# Patient Record
Sex: Female | Born: 1985 | Race: White | Hispanic: No | Smoking: Former smoker
Health system: Southern US, Community
[De-identification: ages and names within clinical notes are randomized; demographics above are authoritative.]

## PROBLEM LIST (undated history)

## (undated) ENCOUNTER — Inpatient Hospital Stay (HOSPITAL_COMMUNITY): Payer: Self-pay

## (undated) DIAGNOSIS — E039 Hypothyroidism, unspecified: Secondary | ICD-10-CM

## (undated) DIAGNOSIS — B009 Herpesviral infection, unspecified: Secondary | ICD-10-CM

## (undated) DIAGNOSIS — O009 Unspecified ectopic pregnancy without intrauterine pregnancy: Secondary | ICD-10-CM

## (undated) HISTORY — DX: Hypothyroidism, unspecified: E03.9

## (undated) HISTORY — PX: OTHER SURGICAL HISTORY: SHX169

## (undated) HISTORY — PX: NO PAST SURGERIES: SHX2092

---
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
## 2003-09-22 ENCOUNTER — Encounter: Admission: RE | Admit: 2003-09-22 | Discharge: 2003-09-22 | Payer: Self-pay | Admitting: Obstetrics and Gynecology

## 2004-02-21 ENCOUNTER — Emergency Department (HOSPITAL_COMMUNITY): Admission: EM | Admit: 2004-02-21 | Discharge: 2004-02-21 | Payer: Self-pay | Admitting: Family Medicine

## 2004-02-26 ENCOUNTER — Other Ambulatory Visit: Admission: RE | Admit: 2004-02-26 | Discharge: 2004-02-26 | Payer: Self-pay | Admitting: Obstetrics and Gynecology

## 2004-07-17 ENCOUNTER — Other Ambulatory Visit: Admission: RE | Admit: 2004-07-17 | Discharge: 2004-07-17 | Payer: Self-pay | Admitting: Obstetrics and Gynecology

## 2004-07-19 ENCOUNTER — Emergency Department: Payer: Self-pay | Admitting: Unknown Physician Specialty

## 2005-02-04 ENCOUNTER — Other Ambulatory Visit: Admission: RE | Admit: 2005-02-04 | Discharge: 2005-02-04 | Payer: Self-pay | Admitting: Obstetrics and Gynecology

## 2005-05-06 ENCOUNTER — Other Ambulatory Visit: Admission: RE | Admit: 2005-05-06 | Discharge: 2005-05-06 | Payer: Self-pay | Admitting: Obstetrics and Gynecology

## 2006-08-07 ENCOUNTER — Emergency Department: Payer: Self-pay | Admitting: Emergency Medicine

## 2006-12-21 ENCOUNTER — Ambulatory Visit (HOSPITAL_COMMUNITY): Admission: RE | Admit: 2006-12-21 | Discharge: 2006-12-21 | Payer: Self-pay | Admitting: Obstetrics and Gynecology

## 2006-12-21 ENCOUNTER — Encounter (INDEPENDENT_AMBULATORY_CARE_PROVIDER_SITE_OTHER): Payer: Self-pay | Admitting: Obstetrics and Gynecology

## 2008-12-01 ENCOUNTER — Emergency Department (HOSPITAL_COMMUNITY): Admission: EM | Admit: 2008-12-01 | Discharge: 2008-12-01 | Payer: Self-pay | Admitting: Emergency Medicine

## 2009-01-27 ENCOUNTER — Emergency Department: Payer: Self-pay | Admitting: Emergency Medicine

## 2009-11-03 ENCOUNTER — Emergency Department: Payer: Self-pay | Admitting: Emergency Medicine

## 2010-06-04 NOTE — Op Note (Signed)
NAMEMANA, HABERL             ACCOUNT NO.:  [id number]   MEDICAL RECORD NO.:  [id number]          PATIENT TYPE:  AMB   LOCATION:  SDC                           FACILITY:  [facility]   PHYSICIAN:  Dawn L. Grewal, M.D.DATE OF BIRTH:  1985-06-15   DATE OF PROCEDURE:  12/21/2006  DATE OF DISCHARGE:                               OPERATIVE REPORT   PREOPERATIVE DIAGNOSIS:  Vulvar condyloma.   POSTOPERATIVE DIAGNOSIS:  Vulvar condyloma.   PROCEDURE:  Excision of vulvar condyloma.   SURGEON:  Kassandra L. Vincente Poli, M.D.   ANESTHESIA:  MAC with local.   FINDINGS:  Bilateral vulvar condyloma.  All specimens sent to pathology.   ESTIMATED BLOOD LOSS:  Minimal.   COMPLICATIONS:  None.   PROCEDURE:  The patient was taken to the operating room.  She was then  given her anesthesia.  She was prepped and draped in the usual sterile  fashion.   Careful exam under anesthesia revealed bilateral labial condyloma in  three areas, one area just above the clitoris and then the other two  areas mid-labia.  Local was infiltrated at all three areas.  All of the  visible condyloma was removed either with scalpel or scissors.  The  bases were then treated with electrocautery with the Bovie. Bactroban  was applied to each area.   The patient tolerated her procedure well.  She went to the recovery room  in stable condition.      Bhumi L. Vincente Poli, M.D.  Electronically Signed     MLG/MEDQ  D:  12/21/2006  T:  12/21/2006  Job:  [id number]

## 2010-10-28 LAB — CBC
MCV: 94
Platelets: 300
RBC: 4.37
WBC: 5.9

## 2011-11-18 ENCOUNTER — Emergency Department: Payer: Self-pay | Admitting: Emergency Medicine

## 2011-12-26 ENCOUNTER — Other Ambulatory Visit: Payer: Self-pay | Admitting: Obstetrics and Gynecology

## 2012-08-18 ENCOUNTER — Other Ambulatory Visit: Payer: Self-pay | Admitting: Obstetrics and Gynecology

## 2013-02-01 ENCOUNTER — Emergency Department: Payer: Self-pay | Admitting: Emergency Medicine

## 2013-02-07 ENCOUNTER — Ambulatory Visit: Payer: Self-pay | Admitting: Chiropractor

## 2014-04-15 ENCOUNTER — Inpatient Hospital Stay (HOSPITAL_COMMUNITY)
Admission: AD | Admit: 2014-04-15 | Discharge: 2014-04-15 | Disposition: A | Discharge status: Home / Self Care | Payer: 59 | Source: Ambulatory Visit | Attending: Obstetrics & Gynecology | Admitting: Obstetrics & Gynecology

## 2014-04-15 ENCOUNTER — Encounter (HOSPITAL_COMMUNITY): Payer: Self-pay | Admitting: *Deleted

## 2014-04-15 ENCOUNTER — Inpatient Hospital Stay (HOSPITAL_COMMUNITY): Payer: 59

## 2014-04-15 DIAGNOSIS — Z3A01 Less than 8 weeks gestation of pregnancy: Secondary | ICD-10-CM | POA: Insufficient documentation

## 2014-04-15 DIAGNOSIS — O4691 Antepartum hemorrhage, unspecified, first trimester: Secondary | ICD-10-CM

## 2014-04-15 DIAGNOSIS — R109 Unspecified abdominal pain: Secondary | ICD-10-CM | POA: Diagnosis not present

## 2014-04-15 DIAGNOSIS — O9989 Other specified diseases and conditions complicating pregnancy, childbirth and the puerperium: Secondary | ICD-10-CM | POA: Insufficient documentation

## 2014-04-15 DIAGNOSIS — O209 Hemorrhage in early pregnancy, unspecified: Secondary | ICD-10-CM

## 2014-04-15 DIAGNOSIS — N939 Abnormal uterine and vaginal bleeding, unspecified: Secondary | ICD-10-CM | POA: Diagnosis present

## 2014-04-15 LAB — URINALYSIS, ROUTINE W REFLEX MICROSCOPIC
BILIRUBIN URINE: NEGATIVE
Glucose, UA: NEGATIVE mg/dL
Ketones, ur: NEGATIVE mg/dL
Leukocytes, UA: NEGATIVE
Nitrite: NEGATIVE
Protein, ur: NEGATIVE mg/dL
Urobilinogen, UA: 0.2 mg/dL (ref 0.0–1.0)
pH: 6 (ref 5.0–8.0)

## 2014-04-15 LAB — CBC WITH DIFFERENTIAL/PLATELET
Basophils Absolute: 0 10*3/uL (ref 0.0–0.1)
Basophils Relative: 0 % (ref 0–1)
Eosinophils Absolute: 0 10*3/uL (ref 0.0–0.7)
Eosinophils Relative: 1 % (ref 0–5)
HEMATOCRIT: 39.1 % (ref 36.0–46.0)
Hemoglobin: 13.3 g/dL (ref 12.0–15.0)
LYMPHS ABS: 2.5 10*3/uL (ref 0.7–4.0)
Lymphocytes Relative: 38 % (ref 12–46)
MCH: 30.5 pg (ref 26.0–34.0)
MCHC: 34 g/dL (ref 30.0–36.0)
MCV: 89.7 fL (ref 78.0–100.0)
Monocytes Absolute: 0.5 10*3/uL (ref 0.1–1.0)
Monocytes Relative: 7 % (ref 3–12)
Neutro Abs: 3.6 10*3/uL (ref 1.7–7.7)
Neutrophils Relative %: 54 % (ref 43–77)
Platelets: 221 10*3/uL (ref 150–400)
RBC: 4.36 MIL/uL (ref 3.87–5.11)
RDW: 12.8 % (ref 11.5–15.5)
WBC: 6.6 10*3/uL (ref 4.0–10.5)

## 2014-04-15 LAB — URINE MICROSCOPIC-ADD ON

## 2014-04-15 LAB — HCG, QUANTITATIVE, PREGNANCY: hCG, Beta Chain, Quant, S: 317 m[IU]/mL — ABNORMAL HIGH (ref <5)

## 2014-04-15 LAB — ABO/RH: ABO/RH(D): A POS

## 2014-04-15 LAB — POCT PREGNANCY, URINE: Preg Test, Ur: POSITIVE — AB

## 2014-04-15 NOTE — MAU Note (Signed)
Pt states is pregnant on clomid. Last sex Wednesday. Prior to Wednesday had cramping, however after Wednesday began having spotting. Called MD office and was told can occur w clomid, however now feels like bleeding is heavier than it should be. Having lower abd pain more on r side.

## 2014-04-15 NOTE — Discharge Instructions (Signed)

## 2014-04-15 NOTE — MAU Note (Signed)
Pt took clomid last month had a period and took clomid again, had cramping and bleeding on Wednesday got worse, took 2 pregnancy tests that were positive.  Had an ultrasound in the office, around 5 weeks.  Bleeding is only when she wipes and having very small clots.

## 2014-04-15 NOTE — MAU Provider Note (Signed)
History     CSN: [id number]  Arrival date and time: 04/15/14 1224   First Provider Initiated Contact with Patient 04/15/14 1314      Chief Complaint  Patient presents with  . Vaginal Bleeding   HPI April Frederick is 29 y.o. G1P0 2977w2d weeks presenting with vaginal bleeding and cramping in early pregancy.  She is a patient of Dr. Lynnell DikeGrewal's.  She first noticed brown discharge 3 days ago, became heavier, brighter red bleeding with tiny clots today.   Rates cramping as 7/10; took Tylenol this am that has helped a little.  Cramping kept her up last night.  This is her second Clomid cycle with + UPT 2 days ago in the office.  Had labs drawn and had U/S that by patient report, did not see anything.      Past Medical History  Diagnosis Date  . Medical history non-contributory     Past Surgical History  Procedure Laterality Date  . No past surgeries      History reviewed. No pertinent family history.  History  Substance Use Topics  . Smoking status: Never Smoker   . Smokeless tobacco: Not on file  . Alcohol Use: No    Allergies: No Known Allergies  No prescriptions prior to admission    Review of Systems  Constitutional: Negative for fever and chills.  Respiratory: Negative for cough and shortness of breath.   Cardiovascular: Negative for chest pain.  Gastrointestinal: Positive for nausea (slight nausea that began today but is relieved with eating) and abdominal pain. Negative for vomiting.  Genitourinary: Negative for dysuria, urgency and frequency.       + for vaginal bleeding with tiny clots.  Neurological: Negative for headaches ( none today but for past 3 days).   Physical Exam   Blood pressure 145/85, pulse 82, temperature 97.6 F (36.4 C), temperature source Oral, resp. rate 18, height 5' 5.5" (1.664 m), weight 208 lb 6 oz (94.518 kg).  Physical Exam  Nursing note and vitals reviewed. Constitutional: She is oriented to person, place, and time. She appears  well-developed and well-nourished. No distress.  HENT:  Head: Normocephalic.  Neck: Normal range of motion.  Cardiovascular: Normal rate.   Respiratory: Effort normal.  GI: Soft. She exhibits no distension and no mass. There is no tenderness. There is no rebound and no guarding.  Genitourinary: There is no rash, tenderness or lesion on the right labia. There is no rash, tenderness or lesion on the left labia. Uterus is not enlarged and not tender. Cervix exhibits no motion tenderness and no friability. Right adnexum displays no tenderness and no fullness. Left adnexum displays no tenderness and no fullness. There is bleeding (small amount of dark brown blood with 2 small clots, one at the os) in the vagina.  Neurological: She is alert and oriented to person, place, and time.  Skin: Skin is warm and dry.  Psychiatric: She has a normal mood and affect. Her behavior is normal.   Results for orders placed or performed during the hospital encounter of 04/15/14 (from the past 24 hour(s))  Urinalysis, Routine w reflex microscopic     Status: Abnormal   Collection Time: 04/15/14 12:47 PM  Result Value Ref Range   Color, Urine YELLOW YELLOW   APPearance CLEAR CLEAR   Specific Gravity, Urine >1.030 (H) 1.005 - 1.030   pH 6.0 5.0 - 8.0   Glucose, UA NEGATIVE NEGATIVE mg/dL   Hgb urine dipstick MODERATE (A) NEGATIVE   Bilirubin  Urine NEGATIVE NEGATIVE   Ketones, ur NEGATIVE NEGATIVE mg/dL   Protein, ur NEGATIVE NEGATIVE mg/dL   Urobilinogen, UA 0.2 0.0 - 1.0 mg/dL   Nitrite NEGATIVE NEGATIVE   Leukocytes, UA NEGATIVE NEGATIVE  Urine microscopic-add on     Status: Abnormal   Collection Time: 04/15/14 12:47 PM  Result Value Ref Range   Squamous Epithelial / LPF RARE RARE   RBC / HPF 3-6 <3 RBC/hpf   Bacteria, UA FEW (A) RARE   Urine-Other MUCOUS PRESENT   Pregnancy, urine POC     Status: Abnormal   Collection Time: 04/15/14 12:55 PM  Result Value Ref Range   Preg Test, Ur POSITIVE (A)  NEGATIVE  CBC with Differential/Platelet     Status: None   Collection Time: 04/15/14  1:20 PM  Result Value Ref Range   WBC 6.6 4.0 - 10.5 K/uL   RBC 4.36 3.87 - 5.11 MIL/uL   Hemoglobin 13.3 12.0 - 15.0 g/dL   HCT 16.1 09.6 - 04.5 %   MCV 89.7 78.0 - 100.0 fL   MCH 30.5 26.0 - 34.0 pg   MCHC 34.0 30.0 - 36.0 g/dL   RDW 40.9 81.1 - 91.4 %   Platelets 221 150 - 400 K/uL   Neutrophils Relative % 54 43 - 77 %   Neutro Abs 3.6 1.7 - 7.7 K/uL   Lymphocytes Relative 38 12 - 46 %   Lymphs Abs 2.5 0.7 - 4.0 K/uL   Monocytes Relative 7 3 - 12 %   Monocytes Absolute 0.5 0.1 - 1.0 K/uL   Eosinophils Relative 1 0 - 5 %   Eosinophils Absolute 0.0 0.0 - 0.7 K/uL   Basophils Relative 0 0 - 1 %   Basophils Absolute 0.0 0.0 - 0.1 K/uL  ABO/Rh     Status: None (Preliminary result)   Collection Time: 04/15/14  1:20 PM  Result Value Ref Range   ABO/RH(D) A POS   hCG, quantitative, pregnancy     Status: Abnormal   Collection Time: 04/15/14  1:20 PM  Result Value Ref Range   hCG, Beta Chain, Quant, S 317 (H) <5 mIU/mL        Study Result     CLINICAL DATA: Three-day history of vaginal bleeding with pain. Patient using Clomid.  EXAM: OBSTETRIC <14 WK [place] AND TRANSVAGINAL OB US  TECHNIQUE: Both transabdominal and transvaginal ultrasound examinations were performed for complete evaluation of the gestation as well as the maternal uterus, adnexal regions, and pelvic cul-de-sac. Transvaginal technique was performed to assess early pregnancy.  COMPARISON: None.  FINDINGS: Maternal uterus/adnexae: There is no intrauterine mass or gestation currently visualized by either transabdominal or transvaginal technique. Uterus measures 5.5 x 4.7 x 3.5 cm.  Right ovary measures 4.2 x 3.6 x 2.7 cm There is a 2.9 x 2.9 x 2.7 cm cyst arising from the right ovary which does contain a mild degree of debris. A simple cyst arising from the right ovary measures approximately 2 x 2 x 2 cm. Left  ovary measures 4.4 x 3.0 x 2.9 cm. There is a simple cyst arising from the left ovary measuring 2.6 x 2.7 x 2.4 cm. There is trace free fluid in the cul-de-sac.  IMPRESSION: No intrauterine mass or gestation. Differential considerations in this circumstance include intrauterine gestation too early to be appreciated by either transabdominal or transvaginal technique; recent spontaneous abortion; possible ectopic gestation. There are cysts arising from each ovary with mild debris in the right ovarian cyst. No  extrauterine gestation seen. Trace free fluid may be physiologic.  Given these findings, close clinical and laboratory surveillance are warranted. Repeat timing of follow-up ultrasound in part will depend on serial beta HCG findings.   Electronically Signed  By: Bretta Bang III M.D.  On: 04/15/2014 14:40     MAU Course  Procedures  MDM 15:00  Reported MSE, HPI, physical exam findings, labs and U/S report to Dr. Langston Masker.  Order given to have patient return for follow up in the office Monday.   May use Tylenol and heat for cramping  Pelvic rest until bleeding resolves.  Assessment and Plan  A;  Vaginal bleeding and cramping at [redacted]w[redacted]d gestation by dates       By U/S no intrauterine gestation or mass, neg for extauterine gestation      Conceived on Clomid       P: Reviewed labs and U/S report with the patient      Instructed patient to take Tylenol prn for cramping, may use heat if that helps Pelvic rest until bleeding stops and  f/u with Dr. Vincente Poli for labs on Monday     Return for worsening sxs--ectopic precautions   Tamarah Bhullar,EVE M 04/15/2014, 2:55 PM

## 2014-05-02 ENCOUNTER — Inpatient Hospital Stay (HOSPITAL_COMMUNITY)
Admission: AD | Admit: 2014-05-02 | Discharge: 2014-05-02 | Disposition: A | Discharge status: Home / Self Care | Payer: 59 | Source: Ambulatory Visit | Attending: Obstetrics and Gynecology | Admitting: Obstetrics and Gynecology

## 2014-05-02 ENCOUNTER — Encounter (HOSPITAL_COMMUNITY): Payer: Self-pay | Admitting: *Deleted

## 2014-05-02 DIAGNOSIS — O001 Tubal pregnancy: Secondary | ICD-10-CM | POA: Diagnosis not present

## 2014-05-02 DIAGNOSIS — R109 Unspecified abdominal pain: Secondary | ICD-10-CM | POA: Insufficient documentation

## 2014-05-02 LAB — CBC WITH DIFFERENTIAL/PLATELET
BASOS ABS: 0 10*3/uL (ref 0.0–0.1)
BASOS PCT: 0 % (ref 0–1)
EOS ABS: 0 10*3/uL (ref 0.0–0.7)
Eosinophils Relative: 1 % (ref 0–5)
HEMATOCRIT: 35.6 % — AB (ref 36.0–46.0)
HEMOGLOBIN: 12.4 g/dL (ref 12.0–15.0)
Lymphocytes Relative: 32 % (ref 12–46)
Lymphs Abs: 2.5 10*3/uL (ref 0.7–4.0)
MCH: 30.8 pg (ref 26.0–34.0)
MCHC: 34.8 g/dL (ref 30.0–36.0)
MCV: 88.6 fL (ref 78.0–100.0)
MONO ABS: 0.6 10*3/uL (ref 0.1–1.0)
MONOS PCT: 7 % (ref 3–12)
Neutro Abs: 4.8 10*3/uL (ref 1.7–7.7)
Neutrophils Relative %: 60 % (ref 43–77)
Platelets: 204 10*3/uL (ref 150–400)
RBC: 4.02 MIL/uL (ref 3.87–5.11)
RDW: 13.2 % (ref 11.5–15.5)
WBC: 7.9 10*3/uL (ref 4.0–10.5)

## 2014-05-02 LAB — HCG, QUANTITATIVE, PREGNANCY: hCG, Beta Chain, Quant, S: 4429 m[IU]/mL — ABNORMAL HIGH (ref <5)

## 2014-05-02 LAB — CREATININE, SERUM
CREATININE: 0.59 mg/dL (ref 0.50–1.10)
GFR calc Af Amer: >90 mL/min (ref >90)

## 2014-05-02 LAB — AST: AST: 20 U/L (ref 0–37)

## 2014-05-02 LAB — BUN: BUN: 13 mg/dL (ref 6–23)

## 2014-05-02 MED ORDER — METHOTREXATE INJECTION FOR WOMEN'S HOSPITAL
50.0000 mg/m2 | Freq: Once | INTRAMUSCULAR | Status: AC
  Administered 2014-05-02: 105 mg via INTRAMUSCULAR
  Filled 2014-05-02: qty 2.1

## 2014-05-02 NOTE — MAU Note (Signed)
Here for cramping and bleeding. Has been being followed. First was suspected cyst.  US did not show IUP, repeat US today, ectopic was seen

## 2014-05-02 NOTE — MAU Note (Signed)
Pink education sheet on ectopic/methotrexate given and reviewed with pt.  Pt has had blood work drawn.  Went to get something to eat in cafeteria.

## 2014-05-05 ENCOUNTER — Inpatient Hospital Stay (HOSPITAL_COMMUNITY)
Admission: AD | Admit: 2014-05-05 | Discharge: 2014-05-05 | Disposition: A | Discharge status: Home / Self Care | Payer: 59 | Source: Ambulatory Visit | Attending: Obstetrics and Gynecology | Admitting: Obstetrics and Gynecology

## 2014-05-05 DIAGNOSIS — O009 Unspecified ectopic pregnancy without intrauterine pregnancy: Secondary | ICD-10-CM

## 2014-05-05 DIAGNOSIS — O001 Tubal pregnancy: Secondary | ICD-10-CM | POA: Insufficient documentation

## 2014-05-05 DIAGNOSIS — Z87891 Personal history of nicotine dependence: Secondary | ICD-10-CM | POA: Insufficient documentation

## 2014-05-05 LAB — HCG, QUANTITATIVE, PREGNANCY: HCG, BETA CHAIN, QUANT, S: 4370 m[IU]/mL — AB (ref <5)

## 2014-05-05 NOTE — MAU Provider Note (Signed)
  History     CSN: [id number]  Arrival date and time: 05/05/14 1149   None     Chief Complaint  Patient presents with  . Follow-up   HPI April Frederick is 29 y.o. G1P0 2127w1d weeks presenting on Day 4 MTX for follow up BHCG.  She denies vaginal bleeding.  She is having cramping in her lower abdomen and lower back.  + for nausea without vomiting. She was seen here on 3/26 with cramping and bleeding and on that date was 6867w2d. She conceived on 2nd cycle of Clomid. On that date--U/S showed no IUGS or mass,  Blood type A+, BHCG 317.  She had follow up BHCG in office.  On 4/12  BHCG 4429; U/S showed no IUP, ectopic  Past Medical History  Diagnosis Date  . Medical history non-contributory     Past Surgical History  Procedure Laterality Date  . No past surgeries      Family History  Problem Relation Age of Onset  . Diabetes Mother     type 2  . Cancer Paternal Aunt   . Diabetes Paternal Grandfather   . Cancer Paternal Grandfather   . Asthma Neg Hx     History  Substance Use Topics  . Smoking status: Former Games developermoker  . Smokeless tobacco: Not on file     Comment: quit 2010  . Alcohol Use: No    Allergies: No Known Allergies  Prescriptions prior to admission  Medication Sig Dispense Refill Last Dose  . acetaminophen (TYLENOL) 500 MG tablet Take 1,000 mg by mouth every 6 (six) hours as needed for mild pain.   04/15/2014 at 0700  . Prenatal Vit-Fe Fumarate-FA (PRENATAL MULTIVITAMIN) TABS tablet Take 1 tablet by mouth daily.   Past Week at Unknown time    Review of Systems  Constitutional: Negative for fever and chills.  Gastrointestinal: Positive for nausea and abdominal pain. Negative for vomiting.  Genitourinary:       Negative for vaginal bleeding  Musculoskeletal: Positive for back pain (lower).   Physical Exam   Blood pressure 125/88, pulse 65, temperature 98.2 F (36.8 C), temperature source Oral, resp. rate 18.  Physical Exam  Constitutional: She appears  well-developed and well-nourished. No distress.  HENT:  Head: Normocephalic.  Genitourinary:  Pelvic not indicated  Psychiatric: She has a normal mood and affect. Her behavior is normal.   Results for orders placed or performed during the hospital encounter of 05/05/14 (from the past 24 hour(s))  hCG, quantitative, pregnancy     Status: Abnormal   Collection Time: 05/05/14 12:11 PM  Result Value Ref Range   hCG, Beta Chain, Quant, S 4370 (H) <5 mIU/mL    MAU Course  Procedures  MDM 13:38  Reported MSE, BHCG to Dr. Arelia SneddonMcComb.  Order given to return on Day 7 for repeat  BHCG.    Assessment and Plan  A:  Known ectopic pregnancy      Follow up BHCG slightly lower from 4429 to 4370 today  P:  Return on Day 7 for Follow up BHCG      Return for increase in abdominal pain or heavy vaginal bleeding  Twilia Yaklin,EVE M 05/05/2014, 1:14 PM

## 2014-05-05 NOTE — Discharge Instructions (Signed)
Ectopic Pregnancy °An ectopic pregnancy happens when a fertilized egg grows outside the uterus. A pregnancy cannot live outside of the uterus. This problem often happens in the fallopian tube. It is often caused by damage to the fallopian tube. °If this problem is found early, you may be treated with medicine. If your tube tears or bursts open (ruptures), you will bleed inside. This is an emergency. You will need surgery. Get help right away.  °SYMPTOMS °You may have normal pregnancy symptoms at first. These include: °· Missing your period. °· Feeling sick to your stomach (nauseous). °· Being tired. °· Having tender breasts. °Then, you may start to have symptoms that are not normal. These include: °· Pain with sex (intercourse). °· Bleeding from the vagina. This includes light bleeding (spotting). °· Belly (abdomen) or lower belly cramping or pain. This may be felt on one side. °· A fast heartbeat (pulse). °· Passing out (fainting) after going poop (bowel movement). °If your tube tears, you may have symptoms such as: °· Really bad pain in the belly or lower belly. This happens suddenly. °· Dizziness. °· Passing out. °· Shoulder pain. °GET HELP RIGHT AWAY IF:  °You have any of these symptoms. This is an emergency. °MAKE SURE YOU: °· Understand these instructions. °· Will watch your condition. °· Will get help right away if you are not doing well or get worse. °Document Released: 04/04/2008 Document Revised: 01/11/2013 Document Reviewed: 08/18/2012 °ExitCare® Patient Information ©2015 [employer]. This information is not intended to replace advice given to you by your health care provider. Make sure you discuss any questions you have with your health care provider. ° °

## 2014-05-05 NOTE — MAU Note (Signed)
Day 4 post MTX. Feeling cramping in abd and back.  No bleeding. Some nausea.

## 2014-05-08 ENCOUNTER — Telehealth: Payer: Self-pay | Admitting: Obstetrics and Gynecology

## 2014-05-08 ENCOUNTER — Inpatient Hospital Stay (HOSPITAL_COMMUNITY)
Admission: AD | Admit: 2014-05-08 | Discharge: 2014-05-08 | Disposition: A | Discharge status: Home / Self Care | Payer: 59 | Source: Ambulatory Visit | Attending: Obstetrics and Gynecology | Admitting: Obstetrics and Gynecology

## 2014-05-08 ENCOUNTER — Inpatient Hospital Stay (HOSPITAL_COMMUNITY)
Admission: AD | Admit: 2014-05-08 | Discharge: 2014-05-08 | Discharge status: Against Medical Advice | Payer: 59 | Source: Ambulatory Visit | Attending: Obstetrics and Gynecology | Admitting: Obstetrics and Gynecology

## 2014-05-08 DIAGNOSIS — O001 Tubal pregnancy: Secondary | ICD-10-CM | POA: Insufficient documentation

## 2014-05-08 DIAGNOSIS — O009 Unspecified ectopic pregnancy without intrauterine pregnancy: Secondary | ICD-10-CM

## 2014-05-08 LAB — CBC WITH DIFFERENTIAL/PLATELET
Basophils Absolute: 0 10*3/uL (ref 0.0–0.1)
Basophils Relative: 0 % (ref 0–1)
EOS PCT: 1 % (ref 0–5)
Eosinophils Absolute: 0 10*3/uL (ref 0.0–0.7)
HEMATOCRIT: 34.9 % — AB (ref 36.0–46.0)
Hemoglobin: 12.2 g/dL (ref 12.0–15.0)
LYMPHS ABS: 2.6 10*3/uL (ref 0.7–4.0)
Lymphocytes Relative: 39 % (ref 12–46)
MCH: 31.5 pg (ref 26.0–34.0)
MCHC: 35 g/dL (ref 30.0–36.0)
MCV: 90.2 fL (ref 78.0–100.0)
MONO ABS: 0.5 10*3/uL (ref 0.1–1.0)
Monocytes Relative: 8 % (ref 3–12)
Neutro Abs: 3.5 10*3/uL (ref 1.7–7.7)
Neutrophils Relative %: 52 % (ref 43–77)
Platelets: 227 10*3/uL (ref 150–400)
RBC: 3.87 MIL/uL (ref 3.87–5.11)
RDW: 13.4 % (ref 11.5–15.5)
WBC: 6.6 10*3/uL (ref 4.0–10.5)

## 2014-05-08 LAB — CREATININE, SERUM
Creatinine, Ser: 0.62 mg/dL (ref 0.50–1.10)
GFR calc Af Amer: >90 mL/min (ref >90)
GFR calc non Af Amer: >90 mL/min (ref >90)

## 2014-05-08 LAB — HCG, QUANTITATIVE, PREGNANCY: HCG, BETA CHAIN, QUANT, S: 3974 m[IU]/mL — AB (ref <5)

## 2014-05-08 LAB — BUN: BUN: 10 mg/dL (ref 6–23)

## 2014-05-08 LAB — AST: AST: 27 U/L (ref 0–37)

## 2014-05-08 MED ORDER — METHOTREXATE INJECTION FOR WOMEN'S HOSPITAL
50.0000 mg/m2 | Freq: Once | INTRAMUSCULAR | Status: AC
  Administered 2014-05-08: 105 mg via INTRAMUSCULAR
  Filled 2014-05-08: qty 2.1

## 2014-05-08 NOTE — MAU Provider Note (Signed)
Subjective:  [name] is a 29 y.o. G1P0  2480w1d weeks presenting for DAY 7  MTX for follow up BHCG. She denies vaginal bleeding or abdominal pain at this time. She was seen here on 3/26 with cramping and bleeding and on that date was 3966w2d. She conceived on 2nd cycle of Clomid. On that date--U/S showed no IUGS or mass, Blood type A+, BHCG 317. She had follow up BHCG in office. On 3/12: US showed possible ectopic,  On 4/12 BHCG 4429; U/S showed no IUP, + ectopic.    Objective:  GENERAL: Well-developed, well-nourished female in no acute distress.  LUNGS: Effort normal SKIN: Warm, dry and without erythema PSYCH: Normal mood and affect  Filed Vitals:   05/08/14 1103  BP: 120/83  Pulse: 62  Temp: 97.6 F (36.4 C)  TempSrc: Oral  Resp: 20    MDM:  Patient states she cannot stay for lab work results> patient has to leave to take her child to the dentist. Patient informed by NP that she would need to sign out AMA given the risks of potential rise in beta hcg level which increases her chance of rupturing her tube. Patient aware> AMA form signed. Patient informed that if levels have not dropped she may need a second dose of MTX. Patient aware.   Beta hcg level: 4/12: 4429 Beta hcg level: 4/15: 4370 Beta hcg level: 4/18: 3974  Discussed levels with Dr. Henderson Cloudomblin who recommends a second dose of MTX. Patient has left AMA> I informed Dr. Henderson Cloudomblin that I would notify the patient of the results and recommendations and ask that she return to MAU ASAP for second injection.   Assessment:  Ectopic pregnancy Repeat Beta hcg levels.  Patient left AMA    April LopeJennifer I Rasch, NP 05/08/2014 11:12 AM

## 2014-05-08 NOTE — Telephone Encounter (Signed)
Patient informed of beta hcg levels; Dr. Henderson Cloudomblin recommends a second dose of MTX. Patient to return to MAU ASAP for labs and a second dose of MTX.

## 2014-05-08 NOTE — MAU Note (Signed)
Pt here for F/U BHCG.  Pt denies pain or bleeding. 

## 2014-05-08 NOTE — MAU Note (Signed)
Pt states she needs to leave, daughter has dentist appt @ 1130.  Blanche EastJ. Rasch NP spoke with pt about the importance of staying for results.  Pt signed out AMA.

## 2014-05-08 NOTE — MAU Note (Signed)
Pt here for 2nd dose of MTX.

## 2014-05-08 NOTE — MAU Provider Note (Signed)
Subjective:   [name] is a 29 y.o. G1P0 2576w1d weeks presenting for DAY 7 MTX for follow up BHCG. She denies vaginal bleeding or abdominal pain at this time. She was seen here on 3/26 with cramping and bleeding and on that date was 5456w2d. She conceived on 2nd cycle of Clomid. On that date--U/S showed no IUGS or mass, Blood type A+, BHCG 317. She had follow up BHCG in office. On 3/12: US showed possible ectopic, On 4/12 BHCG 4429; U/S showed no IUP, + ectopic.  Objective:  GENERAL: Well-developed, well-nourished female in no acute distress.  LUNGS: Effort normal SKIN: Warm, dry and without erythema PSYCH: Normal mood and affect  Filed Vitals:   05/08/14 1619  BP: 126/70  Pulse: 65  Temp: 97.8 F (36.6 C)  TempSrc: Oral  Resp: 18    MDM:  Beta hcg level: 4/12: 4429 Beta hcg level: 4/15: 4370 Beta hcg level: 4/18: 3974  Patient offered a second dose of MTX and agrees with plan of care.  Patient awaiting MTX injection. Report given to Jeani SowEve Leora Platt NP who resumes care of the patient.   17:16  Order for 2nd dose of MTX given by Dr. Henderson Cloudomblin.  2nd MTX dose given by J. Rana SnareLowe, RN.   Discussed with the patient at length, the BHCG results again and the follow up plan.  She was instructed to call Dr. Vincente PoliGrewal for pain or heavy bleeding.   Assessment:  Ectopic pregnancy MXT treatment.   P: 2nd injection of MTX given      Return on Day 4 (Thursday, April 21) for Repeat BHCG   Duane LopeJennifer I Rasch, NP 05/08/2014 4:46 PM

## 2014-05-08 NOTE — Discharge Instructions (Signed)
Methotrexate Treatment for an Ectopic Pregnancy, Care After °Refer to this sheet in the next few weeks. These instructions provide you with information on caring for yourself after your procedure. Your health care provider may also give you more specific instructions. Your treatment has been planned according to current medical practices, but problems sometimes occur. Call your health care provider if you have any problems or questions after your procedure. °WHAT TO EXPECT AFTER THE PROCEDURE °You may have some abdominal cramping, vaginal bleeding, and fatigue in the first few days after taking methotrexate. Some other possible side effects of methotrexate include: °· Nausea. °· Vomiting. °· Diarrhea. °· Mouth sores. °· Swelling or irritation of the lining of your lungs (pneumonitis). °· Liver damage. °· Hair loss. °HOME CARE INSTRUCTIONS  °After you have received the methotrexate medicine, you need to be careful of your activities and watch your condition for several weeks. It may take 1 week before your hormone levels return to normal. °· Keep all follow-up appointments as directed by your health care provider. °· Avoid traveling too far away from your health care provider. °· Do not have sexual intercourse until your health care provider says it is safe to do so. °· You may resume your usual diet. °· Limit strenuous activity. °· Do not take folic acid, prenatal vitamins, or other vitamins that contain folic acid. °· Do not take aspirin, ibuprofen, or naproxen (nonsteroidal anti-inflammatory drugs [NSAIDs]). °· Do not drink alcohol. °SEEK MEDICAL CARE IF:  °· You cannot control your nausea and vomiting. °· You cannot control your diarrhea. °· You have sores in your mouth and want treatment. °· You need pain medicine for your abdominal pain. °· You have a rash. °· You are having a reaction to the medicine. °SEEK IMMEDIATE MEDICAL CARE IF:  °· You have increasing abdominal or pelvic pain. °· You notice increased  bleeding. °· You feel light-headed, or you faint. °· You have shortness of breath. °· Your heart rate increases. °· You have a cough. °· You have chills. °· You have a fever. °Document Released: 12/26/2010 Document Revised: 01/11/2013 Document Reviewed: 10/25/2012 °ExitCare® Patient Information ©2015 [employer]. This information is not intended to replace advice given to you by your health care provider. Make sure you discuss any questions you have with your health care provider. ° °

## 2014-05-11 ENCOUNTER — Inpatient Hospital Stay (HOSPITAL_COMMUNITY)
Admission: AD | Admit: 2014-05-11 | Discharge: 2014-05-11 | Disposition: A | Discharge status: Home / Self Care | Payer: 59 | Source: Ambulatory Visit | Attending: Obstetrics and Gynecology | Admitting: Obstetrics and Gynecology

## 2014-05-11 DIAGNOSIS — O009 Unspecified ectopic pregnancy without intrauterine pregnancy: Secondary | ICD-10-CM

## 2014-05-11 DIAGNOSIS — O001 Tubal pregnancy: Secondary | ICD-10-CM | POA: Insufficient documentation

## 2014-05-11 DIAGNOSIS — Z87891 Personal history of nicotine dependence: Secondary | ICD-10-CM | POA: Insufficient documentation

## 2014-05-11 LAB — HCG, QUANTITATIVE, PREGNANCY: hCG, Beta Chain, Quant, S: 2591 m[IU]/mL — ABNORMAL HIGH (ref <5)

## 2014-05-11 NOTE — MAU Provider Note (Signed)
Chief Complaint: Follow-up   None     SUBJECTIVE HPI: April Frederick is a 29 y.o. who presents to maternity admissions for Day 4 labs after second dose of MTX given for ectopic pregnancy on 05/08/14.  She denies abdominal pain, vaginal bleeding, vaginal itching/burning, urinary symptoms, h/a, dizziness, n/v, or fever/chills.    Past Medical History  Diagnosis Date  . Medical history non-contributory    Past Surgical History  Procedure Laterality Date  . No past surgeries     History   Social History  . Marital Status: Married    Spouse Name: N/A  . Number of Children: N/A  . Years of Education: N/A   Occupational History  . Not on file.   Social History Main Topics  . Smoking status: Former Games developermoker  . Smokeless tobacco: Not on file     Comment: quit 2010  . Alcohol Use: No  . Drug Use: No  . Sexual Activity: Yes    Birth Control/ Protection: None   Other Topics Concern  . Not on file   Social History Narrative   No current facility-administered medications on file prior to encounter.   Current Outpatient Prescriptions on File Prior to Encounter  Medication Sig Dispense Refill  . acetaminophen (TYLENOL) 500 MG tablet Take 1,000 mg by mouth every 6 (six) hours as needed for mild pain.    . Prenatal Vit-Fe Fumarate-FA (PRENATAL MULTIVITAMIN) TABS tablet Take 1 tablet by mouth daily.     No Known Allergies  Review of Systems  Constitutional: Negative for fever, chills and malaise/fatigue.  Eyes: Negative for blurred vision.  Respiratory: Negative for cough and shortness of breath.   Cardiovascular: Negative for chest pain.  Gastrointestinal: Negative for heartburn, nausea, vomiting and abdominal pain.  Genitourinary: Negative for dysuria, urgency and frequency.  Musculoskeletal: Negative.   Neurological: Negative for dizziness and headaches.  Psychiatric/Behavioral: Negative for depression.   OBJECTIVE Blood pressure 131/67, pulse 67, temperature 97.9 F  (36.6 C), temperature source Oral, resp. rate 18. GENERAL: Well-developed, well-nourished female in no acute distress.  HEENT: Normocephalic HEART: normal rate RESP: normal effort ABDOMEN: Soft, non-tender EXTREMITIES: Nontender, no edema NEURO: Alert and oriented   LAB RESULTS Results for orders placed or performed during the hospital encounter of 05/11/14 (from the past 24 hour(s))  hCG, quantitative, pregnancy     Status: Abnormal   Collection Time: 05/11/14  6:20 PM  Result Value Ref Range   hCG, Beta Chain, Quant, S 2591 (H) <5 mIU/mL    ASSESSMENT 1. Ectopic pregnancy     PLAN Consult Dr Vincente PoliGrewal Discharge home      Follow-up Information    [facility]   Why:  As scheduled Sunday, 05/14/14, or sooner as needed for emergencies   Contact information:   [address] [id number] [place] [id number] [phone]      Sharen CounterLisa Leftwich-Kirby Certified Nurse-Midwife 05/11/2014  8:07 PM

## 2014-05-11 NOTE — Discharge Instructions (Signed)
°Ectopic Pregnancy °An ectopic pregnancy is when the fertilized egg attaches (implants) outside the uterus. Most ectopic pregnancies occur in the fallopian tube. Rarely do ectopic pregnancies occur on the ovary, intestine, pelvis, or cervix. In an ectopic pregnancy, the fertilized egg does not have the ability to develop into a normal, healthy baby.  °A ruptured ectopic pregnancy is one in which the fallopian tube gets torn or bursts and results in internal bleeding. Often there is intense abdominal pain, and sometimes, vaginal bleeding. Having an ectopic pregnancy can be life threatening. If left untreated, this dangerous condition can lead to a blood transfusion, abdominal surgery, or even death. °CAUSES  °Damage to the fallopian tubes is the suspected cause in most ectopic pregnancies.  °RISK FACTORS °Depending on your circumstances, the risk of having an ectopic pregnancy will vary. The level of risk can be divided into three categories. °High Risk °· You have gone through infertility treatment. °· You have had a previous ectopic pregnancy. °· You have had previous tubal surgery. °· You have had previous surgery to have the fallopian tubes tied (tubal ligation). °· You have tubal problems or diseases. °· You have been exposed to DES. DES is a medicine that was used until 1971 and had effects on babies whose mothers took the medicine. °· You become pregnant while using an intrauterine device (IUD) for birth control.  °Moderate Risk °· You have a history of infertility. °· You have a history of a sexually transmitted infection (STI). °· You have a history of pelvic inflammatory disease (PID). °· You have scarring from endometriosis. °· You have multiple sexual partners. °· You smoke.  °Low Risk °· You have had previous pelvic surgery. °· You use vaginal douching. °· You became sexually active before 29 years of age. °SIGNS AND SYMPTOMS  °An ectopic pregnancy should be suspected in anyone who has missed a period  and has abdominal pain or bleeding. °· You may experience normal pregnancy symptoms, such as: °¨ Nausea. °¨ Tiredness. °¨ Breast tenderness. °· Other symptoms may include: °¨ Pain with intercourse. °¨ Irregular vaginal bleeding or spotting. °¨ Cramping or pain on one side or in the lower abdomen. °¨ Fast heartbeat. °¨ Passing out while having a bowel movement. °· Symptoms of a ruptured ectopic pregnancy and internal bleeding may include: °¨ Sudden, severe pain in the abdomen and pelvis. °¨ Dizziness or fainting. °¨ Pain in the shoulder area. °DIAGNOSIS  °Tests that may be performed include: °· A pregnancy test. °· An ultrasound test. °· Testing the specific level of pregnancy hormone in the bloodstream. °· Taking a sample of uterus tissue (dilation and curettage, D&C). °· Surgery to perform a visual exam of the inside of the abdomen using a thin, lighted tube with a tiny camera on the end (laparoscope). °TREATMENT  °An injection of a medicine called methotrexate may be given. This medicine causes the pregnancy tissue to be absorbed. It is given if: °· The diagnosis is made early. °· The fallopian tube has not ruptured. °· You are considered to be a good candidate for the medicine. °Usually, pregnancy hormone blood levels are checked after methotrexate treatment. This is to be sure the medicine is effective. It may take 4-6 weeks for the pregnancy to be absorbed (though most pregnancies will be absorbed by 3 weeks). °Surgical treatment may be needed. A laparoscope may be used to remove the pregnancy tissue. If severe internal bleeding occurs, a cut (incision) may be made in the lower abdomen (laparotomy), and the ectopic   pregnancy is removed. This stops the bleeding. Part of the fallopian tube, or the whole tube, may be removed as well (salpingectomy). After surgery, pregnancy hormone tests may be done to be sure there is no pregnancy tissue left. You may receive a Rho (D) immune globulin shot if you are Rh negative  and the father is Rh positive, or if you do not know the Rh type of the father. This is to prevent problems with any future pregnancy. °SEEK IMMEDIATE MEDICAL CARE IF:  °You have any symptoms of an ectopic pregnancy. This is a medical emergency. °MAKE SURE YOU: °· Understand these instructions. °· Will watch your condition. °· Will get help right away if you are not doing well or get worse. °Document Released: 02/14/2004 Document Revised: 05/23/2013 Document Reviewed: 08/05/2012 °[employer]® Patient Information ©2015 [employer]. This information is not intended to replace advice given to you by your health care provider. Make sure you discuss any questions you have with your health care provider. ° ° °

## 2014-05-11 NOTE — MAU Note (Signed)
Pt here for F/U BHCG, denies pain or bleeding.

## 2014-05-14 ENCOUNTER — Inpatient Hospital Stay (HOSPITAL_COMMUNITY)
Admission: AD | Admit: 2014-05-14 | Discharge: 2014-05-14 | Disposition: A | Discharge status: Home / Self Care | Payer: 59 | Source: Ambulatory Visit | Attending: Obstetrics & Gynecology | Admitting: Obstetrics & Gynecology

## 2014-05-14 DIAGNOSIS — O001 Tubal pregnancy: Secondary | ICD-10-CM | POA: Insufficient documentation

## 2014-05-14 DIAGNOSIS — O009 Unspecified ectopic pregnancy without intrauterine pregnancy: Secondary | ICD-10-CM

## 2014-05-14 DIAGNOSIS — Z09 Encounter for follow-up examination after completed treatment for conditions other than malignant neoplasm: Secondary | ICD-10-CM

## 2014-05-14 DIAGNOSIS — Z87891 Personal history of nicotine dependence: Secondary | ICD-10-CM | POA: Diagnosis not present

## 2014-05-14 LAB — HCG, QUANTITATIVE, PREGNANCY: HCG, BETA CHAIN, QUANT, S: 1200 m[IU]/mL — AB (ref <5)

## 2014-05-14 NOTE — MAU Provider Note (Signed)
  History     CSN: [id number]  Arrival date and time: 05/14/14 1407   None     Chief Complaint  Patient presents with  . Labs Only   HPI Chelsea AusMichelle Hearty is 29 y.o. G1P0010 (current ectopic)  presents for repeat BHCG on day 7 of second MTX dose for ectopic pregnancy.  4/12  BHCG 4429, MTX given; 4/15 BHCG 4370; 4/18 BHCG (Day 7) 3974--2nd dose of MTX administered.  4/21 BHCG 2591. Today BHCG 1200.   She scant vaginal bleeding and mild abdominal pain.  She is a patient of Dr. Lynnell DikeGrewal's.    Past Medical History  Diagnosis Date  . Medical history non-contributory    Past Surgical History  Procedure Laterality Date  . No past surgeries     Family History  Problem Relation Age of Onset  . Diabetes Mother     type 2  . Cancer Paternal Aunt   . Diabetes Paternal Grandfather   . Cancer Paternal Grandfather   . Asthma Neg Hx     History  Substance Use Topics  . Smoking status: Former Games developermoker  . Smokeless tobacco: Not on file     Comment: quit 2010  . Alcohol Use: No   Allergies: No Known Allergies  Prescriptions prior to admission  Medication Sig Dispense Refill Last Dose  . acetaminophen (TYLENOL) 500 MG tablet Take 1,000 mg by mouth every 6 (six) hours as needed for mild pain.   04/15/2014 at 0700  . Prenatal Vit-Fe Fumarate-FA (PRENATAL MULTIVITAMIN) TABS tablet Take 1 tablet by mouth daily.   Past Week at Unknown time   Review of Systems  Constitutional: Negative for fever and chills.  Gastrointestinal: Positive for abdominal pain (mild).  Genitourinary:       Positive for vaginal spotting.  Musculoskeletal: Positive for back pain (low back pain).   Physical Exam   Blood pressure 140/82, pulse 71, temperature 98.3 F (36.8 C), resp. rate 18.  Physical Exam  Constitutional: She is oriented to person, place, and time. She appears well-developed and well-nourished. No distress.  HENT:  Head: Normocephalic.  Genitourinary:  Pelvic not indicated  Neurological: She  is alert and oriented to person, place, and time.  Psychiatric: She has a normal mood and affect. Her behavior is normal.   Results for orders placed or performed during the hospital encounter of 05/14/14 (from the past 24 hour(s))  hCG, quantitative, pregnancy     Status: Abnormal   Collection Time: 05/14/14  2:20 PM  Result Value Ref Range   hCG, Beta Chain, Quant, S 1200 (H) <5 mIU/mL   MAU Course  Procedures  MDM Lab: BHCG Discussed MSE, Labs with Dr. Langston MaskerMorris.  Order given for weekly BHCGs in the office beginning May 2.  Reviewed labs with the patient, instructed patient to call the office tomorrow and schedule BHCG in the office a week from tomorrow.    Assessment and Plan  A:  Follow up BHCG for 2nd dose of MTX for ectopic pregnancy  P:  F/U with Dr. Vincente PoliGrewal for repeat labs a week from tomorrow     Call for worsening sxs.  KEY,EVE M 05/14/2014, 2:40 PM

## 2014-05-14 NOTE — Discharge Instructions (Signed)
Ectopic Pregnancy °An ectopic pregnancy happens when a fertilized egg grows outside the uterus. A pregnancy cannot live outside of the uterus. This problem often happens in the fallopian tube. It is often caused by damage to the fallopian tube. °If this problem is found early, you may be treated with medicine. If your tube tears or bursts open (ruptures), you will bleed inside. This is an emergency. You will need surgery. Get help right away.  °SYMPTOMS °You may have normal pregnancy symptoms at first. These include: °· Missing your period. °· Feeling sick to your stomach (nauseous). °· Being tired. °· Having tender breasts. °Then, you may start to have symptoms that are not normal. These include: °· Pain with sex (intercourse). °· Bleeding from the vagina. This includes light bleeding (spotting). °· Belly (abdomen) or lower belly cramping or pain. This may be felt on one side. °· A fast heartbeat (pulse). °· Passing out (fainting) after going poop (bowel movement). °If your tube tears, you may have symptoms such as: °· Really bad pain in the belly or lower belly. This happens suddenly. °· Dizziness. °· Passing out. °· Shoulder pain. °GET HELP RIGHT AWAY IF:  °You have any of these symptoms. This is an emergency. °MAKE SURE YOU: °· Understand these instructions. °· Will watch your condition. °· Will get help right away if you are not doing well or get worse. °Document Released: 04/04/2008 Document Revised: 01/11/2013 Document Reviewed: 08/18/2012 °ExitCare® Patient Information ©2015 [employer]. This information is not intended to replace advice given to you by your health care provider. Make sure you discuss any questions you have with your health care provider. ° °

## 2014-05-14 NOTE — MAU Note (Signed)
Pt presents to MAU for repeat labs post methotrexate injection. Reports mild pain and vaginal spotting

## 2014-10-19 LAB — OB RESULTS CONSOLE RUBELLA ANTIBODY, IGM
Rubella: IMMUNE
Rubella: IMMUNE

## 2014-10-19 LAB — OB RESULTS CONSOLE ANTIBODY SCREEN
ANTIBODY SCREEN: NEGATIVE
Antibody Screen: NEGATIVE

## 2014-10-19 LAB — OB RESULTS CONSOLE ABO/RH
RH TYPE: POSITIVE
RH Type: POSITIVE

## 2014-10-19 LAB — OB RESULTS CONSOLE GC/CHLAMYDIA
CHLAMYDIA, DNA PROBE: NEGATIVE
Gonorrhea: NEGATIVE

## 2014-10-19 LAB — OB RESULTS CONSOLE HEPATITIS B SURFACE ANTIGEN
HEP B S AG: NEGATIVE
HEP B S AG: NEGATIVE

## 2014-10-19 LAB — OB RESULTS CONSOLE RPR
RPR: NONREACTIVE
RPR: NONREACTIVE

## 2014-10-19 LAB — OB RESULTS CONSOLE HIV ANTIBODY (ROUTINE TESTING)
HIV: NONREACTIVE
HIV: NONREACTIVE

## 2014-11-01 LAB — OB RESULTS CONSOLE GC/CHLAMYDIA
Chlamydia: NEGATIVE
GC PROBE AMP, GENITAL: NEGATIVE

## 2015-01-21 NOTE — L&D Delivery Note (Signed)
Delivery Note At 11:31 PM a viable female was delivered via Vaginal, Spontaneous Delivery (Presentation: Left Occiput Anterior).  APGAR: 9 9 weight pending Placenta status: Intact, Spontaneous.  Cord:  with the following complicationsnone: .  Cord pH: not obtained  Anesthesia:  epidural Episiotomy: none  Lacerations: first  Suture Repair: 3.0 chromic Est. Blood Loss (mL):  300  Mom to postpartum.  Baby to Couplet care / Skin to Skin.  Nasirah Sachs,Zamya L 05/26/2015, 11:42 PM

## 2015-02-18 ENCOUNTER — Encounter (HOSPITAL_COMMUNITY): Payer: Self-pay | Admitting: *Deleted

## 2015-03-08 ENCOUNTER — Ambulatory Visit (HOSPITAL_COMMUNITY)
Admission: RE | Admit: 2015-03-08 | Discharge: 2015-03-08 | Disposition: A | Discharge status: Home / Self Care | Payer: 59 | Source: Ambulatory Visit | Attending: Obstetrics and Gynecology | Admitting: Obstetrics and Gynecology

## 2015-03-08 ENCOUNTER — Other Ambulatory Visit (HOSPITAL_COMMUNITY): Payer: Self-pay | Admitting: Obstetrics and Gynecology

## 2015-03-08 DIAGNOSIS — R609 Edema, unspecified: Secondary | ICD-10-CM

## 2015-03-08 DIAGNOSIS — M7989 Other specified soft tissue disorders: Secondary | ICD-10-CM | POA: Diagnosis present

## 2015-03-08 NOTE — Progress Notes (Signed)
*  PRELIMINARY RESULTS* Vascular Ultrasound Left lower extremity venous duplex has been completed.  Preliminary findings: No evidence of DVT or baker's cyst.   Attempted call report to Dr. [name]. Left voice message on [facility] voice mail line.   Farrel Demark, RDMS, RVT  03/08/2015, 2:19 PM

## 2015-04-24 LAB — OB RESULTS CONSOLE GBS: STREP GROUP B AG: NEGATIVE

## 2015-04-28 ENCOUNTER — Inpatient Hospital Stay (HOSPITAL_COMMUNITY)
Admission: AD | Admit: 2015-04-28 | Discharge: 2015-04-29 | Disposition: A | Discharge status: Home / Self Care | Payer: 59 | Source: Ambulatory Visit | Attending: Obstetrics and Gynecology | Admitting: Obstetrics and Gynecology

## 2015-04-28 DIAGNOSIS — M549 Dorsalgia, unspecified: Secondary | ICD-10-CM | POA: Insufficient documentation

## 2015-04-28 DIAGNOSIS — R109 Unspecified abdominal pain: Secondary | ICD-10-CM | POA: Insufficient documentation

## 2015-04-29 ENCOUNTER — Encounter (HOSPITAL_COMMUNITY): Payer: Self-pay | Admitting: *Deleted

## 2015-04-29 DIAGNOSIS — R109 Unspecified abdominal pain: Secondary | ICD-10-CM | POA: Diagnosis not present

## 2015-04-29 DIAGNOSIS — M549 Dorsalgia, unspecified: Secondary | ICD-10-CM | POA: Diagnosis not present

## 2015-04-29 LAB — URINALYSIS, ROUTINE W REFLEX MICROSCOPIC
Bilirubin Urine: NEGATIVE
GLUCOSE, UA: 250 mg/dL — AB
HGB URINE DIPSTICK: NEGATIVE
Ketones, ur: NEGATIVE mg/dL
LEUKOCYTES UA: NEGATIVE
Nitrite: NEGATIVE
Protein, ur: NEGATIVE mg/dL
pH: 5.5 (ref 5.0–8.0)

## 2015-04-29 NOTE — Discharge Instructions (Signed)
Braxton Hicks Contractions °Contractions of the uterus can occur throughout pregnancy. Contractions are not always a sign that you are in labor.  °WHAT ARE BRAXTON HICKS CONTRACTIONS?  °Contractions that occur before labor are called Braxton Hicks contractions, or false labor. Toward the end of pregnancy (32-34 weeks), these contractions can develop more often and may become more forceful. This is not true labor because these contractions do not result in opening (dilatation) and thinning of the cervix. They are sometimes difficult to tell apart from true labor because these contractions can be forceful and people have different pain tolerances. You should not feel embarrassed if you go to the hospital with false labor. Sometimes, the only way to tell if you are in true labor is for your health care provider to look for changes in the cervix. °If there are no prenatal problems or other health problems associated with the pregnancy, it is completely safe to be sent home with false labor and await the onset of true labor. °HOW CAN YOU TELL THE DIFFERENCE BETWEEN TRUE AND FALSE LABOR? °False Labor °· The contractions of false labor are usually shorter and not as hard as those of true labor.   °· The contractions are usually irregular.   °· The contractions are often felt in the front of the lower abdomen and in the groin.   °· The contractions may go away when you walk around or change positions while lying down.   °· The contractions get weaker and are shorter lasting as time goes on.   °· The contractions do not usually become progressively stronger, regular, and closer together as with true labor.   °True Labor °· Contractions in true labor last 30-70 seconds, become very regular, usually become more intense, and increase in frequency.   °· The contractions do not go away with walking.   °· The discomfort is usually felt in the top of the uterus and spreads to the lower abdomen and low back.   °· True labor can be  determined by your health care provider with an exam. This will show that the cervix is dilating and getting thinner.   °WHAT TO REMEMBER °· Keep up with your usual exercises and follow other instructions given by your health care provider.   °· Take medicines as directed by your health care provider.   °· Keep your regular prenatal appointments.   °· Eat and drink lightly if you think you are going into labor.   °· If Braxton Hicks contractions are making you uncomfortable:   °¨ Change your position from lying down or resting to walking, or from walking to resting.   °¨ Sit and rest in a tub of warm water.   °¨ Drink 2-3 glasses of water. Dehydration may cause these contractions.   °¨ Do slow and deep breathing several times an hour.   °WHEN SHOULD I SEEK IMMEDIATE MEDICAL CARE? °Seek immediate medical care if: °· Your contractions become stronger, more regular, and closer together.   °· You have fluid leaking or gushing from your vagina.   °· You have a fever.   °· You pass blood-tinged mucus.   °· You have vaginal bleeding.   °· You have continuous abdominal pain.   °· You have low back pain that you never had before.   °· You feel your baby's head pushing down and causing pelvic pressure.   °· Your baby is not moving as much as it used to.   °  °This information is not intended to replace advice given to you by your health care provider. Make sure you discuss any questions you have with your health care   provider. °  °Document Released: 01/06/2005 Document Revised: 01/11/2013 Document Reviewed: 10/18/2012 °Elsevier Interactive Patient Education ©2016 [employer] °Fetal Movement Counts °Patient Name: __________________________________________________ Patient Due Date: ____________________ °Performing a fetal movement count is highly recommended in high-risk pregnancies, but it is good for every pregnant woman to do. Your health care provider may ask you to start counting fetal movements at 28 weeks of the  pregnancy. Fetal movements often increase: °· After eating a full meal. °· After physical activity. °· After eating or drinking something sweet or cold. °· At rest. °Pay attention to when you feel the baby is most active. This will help you notice a pattern of your baby's sleep and wake cycles and what factors contribute to an increase in fetal movement. It is important to perform a fetal movement count at the same time each day when your baby is normally most active.  °HOW TO COUNT FETAL MOVEMENTS °· Find a quiet and comfortable area to sit or lie down on your left side. Lying on your left side provides the best blood and oxygen circulation to your baby. °· Write down the day and time on a sheet of paper or in a journal. °· Start counting kicks, flutters, swishes, rolls, or jabs in a 2-hour period. You should feel at least 10 movements within 2 hours. °· If you do not feel 10 movements in 2 hours, wait 2-3 hours and count again. Look for a change in the pattern or not enough counts in 2 hours. °SEEK MEDICAL CARE IF: °· You feel less than 10 counts in 2 hours, tried twice. °· There is no movement in over an hour. °· The pattern is changing or taking longer each day to reach 10 counts in 2 hours. °· You feel the baby is not moving as he or she usually does. °Date: ____________ Movements: ____________ Start time: ____________ Finish time: ____________  °Date: ____________ Movements: ____________ Start time: ____________ Finish time: ____________ °Date: ____________ Movements: ____________ Start time: ____________ Finish time: ____________ °Date: ____________ Movements: ____________ Start time: ____________ Finish time: ____________ °Date: ____________ Movements: ____________ Start time: ____________ Finish time: ____________ °Date: ____________ Movements: ____________ Start time: ____________ Finish time: ____________ °Date: ____________ Movements: ____________ Start time: ____________ Finish time: ____________ °Date:  ____________ Movements: ____________ Start time: ____________ Finish time: ____________  °Date: ____________ Movements: ____________ Start time: ____________ Finish time: ____________ °Date: ____________ Movements: ____________ Start time: ____________ Finish time: ____________ °Date: ____________ Movements: ____________ Start time: ____________ Finish time: ____________ °Date: ____________ Movements: ____________ Start time: ____________ Finish time: ____________ °Date: ____________ Movements: ____________ Start time: ____________ Finish time: ____________ °Date: ____________ Movements: ____________ Start time: ____________ Finish time: ____________ °Date: ____________ Movements: ____________ Start time: ____________ Finish time: ____________  °Date: ____________ Movements: ____________ Start time: ____________ Finish time: ____________ °Date: ____________ Movements: ____________ Start time: ____________ Finish time: ____________ °Date: ____________ Movements: ____________ Start time: ____________ Finish time: ____________ °Date: ____________ Movements: ____________ Start time: ____________ Finish time: ____________ °Date: ____________ Movements: ____________ Start time: ____________ Finish time: ____________ °Date: ____________ Movements: ____________ Start time: ____________ Finish time: ____________ °Date: ____________ Movements: ____________ Start time: ____________ Finish time: ____________  °Date: ____________ Movements: ____________ Start time: ____________ Finish time: ____________ °Date: ____________ Movements: ____________ Start time: ____________ Finish time: ____________ °Date: ____________ Movements: ____________ Start time: ____________ Finish time: ____________ °Date: ____________ Movements: ____________ Start time: ____________ Finish time: ____________ °Date: ____________ Movements: ____________ Start time: ____________ Finish time: ____________ °Date: ____________ Movements: ____________ Start  time: ____________ Finish time:   ____________ °Date: ____________ Movements: ____________ Start time: ____________ Finish time: ____________  °Date: ____________ Movements: ____________ Start time: ____________ Finish time: ____________ °Date: ____________ Movements: ____________ Start time: ____________ Finish time: ____________ °Date: ____________ Movements: ____________ Start time: ____________ Finish time: ____________ °Date: ____________ Movements: ____________ Start time: ____________ Finish time: ____________ °Date: ____________ Movements: ____________ Start time: ____________ Finish time: ____________ °Date: ____________ Movements: ____________ Start time: ____________ Finish time: ____________ °Date: ____________ Movements: ____________ Start time: ____________ Finish time: ____________  °Date: ____________ Movements: ____________ Start time: ____________ Finish time: ____________ °Date: ____________ Movements: ____________ Start time: ____________ Finish time: ____________ °Date: ____________ Movements: ____________ Start time: ____________ Finish time: ____________ °Date: ____________ Movements: ____________ Start time: ____________ Finish time: ____________ °Date: ____________ Movements: ____________ Start time: ____________ Finish time: ____________ °Date: ____________ Movements: ____________ Start time: ____________ Finish time: ____________ °Date: ____________ Movements: ____________ Start time: ____________ Finish time: ____________  °Date: ____________ Movements: ____________ Start time: ____________ Finish time: ____________ °Date: ____________ Movements: ____________ Start time: ____________ Finish time: ____________ °Date: ____________ Movements: ____________ Start time: ____________ Finish time: ____________ °Date: ____________ Movements: ____________ Start time: ____________ Finish time: ____________ °Date: ____________ Movements: ____________ Start time: ____________ Finish time:  ____________ °Date: ____________ Movements: ____________ Start time: ____________ Finish time: ____________ °Date: ____________ Movements: ____________ Start time: ____________ Finish time: ____________  °Date: ____________ Movements: ____________ Start time: ____________ Finish time: ____________ °Date: ____________ Movements: ____________ Start time: ____________ Finish time: ____________ °Date: ____________ Movements: ____________ Start time: ____________ Finish time: ____________ °Date: ____________ Movements: ____________ Start time: ____________ Finish time: ____________ °Date: ____________ Movements: ____________ Start time: ____________ Finish time: ____________ °Date: ____________ Movements: ____________ Start time: ____________ Finish time: ____________ °  °This information is not intended to replace advice given to you by your health care provider. Make sure you discuss any questions you have with your health care provider. °  °Document Released: 02/05/2006 Document Revised: 01/27/2014 Document Reviewed: 11/03/2011 °Elsevier Interactive Patient Education ©2016 [employer] °Third Trimester of Pregnancy °The third trimester is from week 29 through week 42, months 7 through 9. This trimester is when your unborn baby (fetus) is growing very fast. At the end of the ninth month, the unborn baby is about 20 inches in length. It weighs about 6-10 pounds.  °HOME CARE  °· Avoid all smoking, herbs, and alcohol. Avoid drugs not approved by your doctor. °· Do not use any tobacco products, including cigarettes, chewing tobacco, and electronic cigarettes. If you need help quitting, ask your doctor. You may get counseling or other support to help you quit. °· Only take medicine as told by your doctor. Some medicines are safe and some are not during pregnancy. °· Exercise only as told by your doctor. Stop exercising if you start having cramps. °· Eat regular, healthy meals. °· Wear a good support bra if your breasts are  tender. °· Do not use hot tubs, steam rooms, or saunas. °· Wear your seat belt when driving. °· Avoid raw meat, uncooked cheese, and liter boxes and soil used by cats. °· Take your prenatal vitamins. °· Take 1500-2000 milligrams of calcium daily starting at the 20th week of pregnancy until you deliver your baby. °· Try taking medicine that helps you poop (stool softener) as needed, and if your doctor approves. Eat more fiber by eating fresh fruit, vegetables, and whole grains. Drink enough fluids to keep your pee (urine) clear or pale yellow. °· Take warm water baths (sitz baths) to soothe pain or discomfort caused by hemorrhoids. Use hemorrhoid   cream if your doctor approves. °· If you have puffy, bulging veins (varicose veins), wear support hose. Raise (elevate) your feet for 15 minutes, 3-4 times a day. Limit salt in your diet. °· Avoid heavy lifting, wear low heels, and sit up straight. °· Rest with your legs raised if you have leg cramps or low back pain. °· Visit your dentist if you have not gone during your pregnancy. Use a soft toothbrush to brush your teeth. Be gentle when you floss. °· You can have sex (intercourse) unless your doctor tells you not to. °· Do not travel far distances unless you must. Only do so with your doctor's approval. °· Take prenatal classes. °· Practice driving to the hospital. °· Pack your hospital bag. °· Prepare the baby's room. °· Go to your doctor visits. °GET HELP IF: °· You are not sure if you are in labor or if your water has broken. °· You are dizzy. °· You have mild cramps or pressure in your lower belly (abdominal). °· You have a nagging pain in your belly area. °· You continue to feel sick to your stomach (nauseous), throw up (vomit), or have watery poop (diarrhea). °· You have bad smelling fluid coming from your vagina. °· You have pain with peeing (urination). °GET HELP RIGHT AWAY IF:  °· You have a fever. °· You are leaking fluid from your vagina. °· You are spotting or  bleeding from your vagina. °· You have severe belly cramping or pain. °· You lose or gain weight rapidly. °· You have trouble catching your breath and have chest pain. °· You notice sudden or extreme puffiness (swelling) of your face, hands, ankles, feet, or legs. °· You have not felt the baby move in over an hour. °· You have severe headaches that do not go away with medicine. °· You have vision changes. °  °This information is not intended to replace advice given to you by your health care provider. Make sure you discuss any questions you have with your health care provider. °  °Document Released: 04/02/2009 Document Revised: 01/27/2014 Document Reviewed: 03/09/2012 °Elsevier Interactive Patient Education ©2016 [employer] ° °

## 2015-04-29 NOTE — MAU Note (Signed)
Abd cramps since 0400 on and off. About 1830 tonight started having a lot of back pain and contractions. Pelvic pressure. Denies LOF or bleeding

## 2015-05-05 ENCOUNTER — Encounter (HOSPITAL_COMMUNITY): Payer: Self-pay

## 2015-05-05 ENCOUNTER — Inpatient Hospital Stay (HOSPITAL_COMMUNITY)
Admission: AD | Admit: 2015-05-05 | Discharge: 2015-05-05 | Disposition: A | Discharge status: Home / Self Care | Payer: 59 | Source: Ambulatory Visit | Attending: Obstetrics and Gynecology | Admitting: Obstetrics and Gynecology

## 2015-05-05 DIAGNOSIS — Z87891 Personal history of nicotine dependence: Secondary | ICD-10-CM | POA: Insufficient documentation

## 2015-05-05 DIAGNOSIS — R0989 Other specified symptoms and signs involving the circulatory and respiratory systems: Secondary | ICD-10-CM

## 2015-05-05 DIAGNOSIS — O163 Unspecified maternal hypertension, third trimester: Secondary | ICD-10-CM | POA: Diagnosis not present

## 2015-05-05 DIAGNOSIS — O1203 Gestational edema, third trimester: Secondary | ICD-10-CM | POA: Diagnosis not present

## 2015-05-05 DIAGNOSIS — Z3A37 37 weeks gestation of pregnancy: Secondary | ICD-10-CM | POA: Diagnosis not present

## 2015-05-05 DIAGNOSIS — I10 Essential (primary) hypertension: Secondary | ICD-10-CM | POA: Diagnosis not present

## 2015-05-05 DIAGNOSIS — O9989 Other specified diseases and conditions complicating pregnancy, childbirth and the puerperium: Secondary | ICD-10-CM

## 2015-05-05 DIAGNOSIS — R519 Headache, unspecified: Secondary | ICD-10-CM

## 2015-05-05 DIAGNOSIS — R51 Headache: Secondary | ICD-10-CM | POA: Diagnosis present

## 2015-05-05 DIAGNOSIS — M7989 Other specified soft tissue disorders: Secondary | ICD-10-CM

## 2015-05-05 LAB — PROTEIN / CREATININE RATIO, URINE
Creatinine, Urine: 29 mg/dL
Total Protein, Urine: <6 mg/dL

## 2015-05-05 LAB — COMPREHENSIVE METABOLIC PANEL
ALT: 19 U/L (ref 14–54)
AST: 17 U/L (ref 15–41)
Albumin: 2.8 g/dL — ABNORMAL LOW (ref 3.5–5.0)
Alkaline Phosphatase: 71 U/L (ref 38–126)
Anion gap: 7 (ref 5–15)
BUN: 6 mg/dL (ref 6–20)
CO2: 24 mmol/L (ref 22–32)
CREATININE: 0.47 mg/dL (ref 0.44–1.00)
Calcium: 9.2 mg/dL (ref 8.9–10.3)
Chloride: 106 mmol/L (ref 101–111)
GFR calc Af Amer: >60 mL/min (ref >60)
Glucose, Bld: 110 mg/dL — ABNORMAL HIGH (ref 65–99)
Potassium: 4 mmol/L (ref 3.5–5.1)
Sodium: 137 mmol/L (ref 135–145)
Total Bilirubin: 0.4 mg/dL (ref 0.3–1.2)
Total Protein: 6.1 g/dL — ABNORMAL LOW (ref 6.5–8.1)

## 2015-05-05 LAB — URINALYSIS, ROUTINE W REFLEX MICROSCOPIC
Bilirubin Urine: NEGATIVE
GLUCOSE, UA: NEGATIVE mg/dL
HGB URINE DIPSTICK: NEGATIVE
Ketones, ur: NEGATIVE mg/dL
Leukocytes, UA: NEGATIVE
NITRITE: NEGATIVE
Protein, ur: NEGATIVE mg/dL
Specific Gravity, Urine: <1.005 — ABNORMAL LOW (ref 1.005–1.030)
pH: 5.5 (ref 5.0–8.0)

## 2015-05-05 LAB — CBC
HCT: 35.7 % — ABNORMAL LOW (ref 36.0–46.0)
Hemoglobin: 12.3 g/dL (ref 12.0–15.0)
MCH: 31.5 pg (ref 26.0–34.0)
MCHC: 34.5 g/dL (ref 30.0–36.0)
MCV: 91.5 fL (ref 78.0–100.0)
Platelets: 221 10*3/uL (ref 150–400)
RBC: 3.9 MIL/uL (ref 3.87–5.11)
RDW: 13.9 % (ref 11.5–15.5)
WBC: 10.8 10*3/uL — ABNORMAL HIGH (ref 4.0–10.5)

## 2015-05-05 MED ORDER — LABETALOL HCL 5 MG/ML IV SOLN: 20.0000 mg | INTRAVENOUS | Status: DC | PRN

## 2015-05-05 MED ORDER — HYDRALAZINE HCL 20 MG/ML IJ SOLN: 10.0000 mg | Freq: Once | INTRAMUSCULAR | Status: DC | PRN

## 2015-05-05 NOTE — Discharge Instructions (Signed)
Edema °Edema is an abnormal buildup of fluids in your body tissues. Edema is somewhat dependent on gravity to pull the fluid to the lowest place in your body. That makes the condition more common in the legs and thighs (lower extremities). Painless swelling of the feet and ankles is common and becomes more likely as you get older. It is also common in looser tissues, like around your eyes.  °When the affected area is squeezed, the fluid may move out of that spot and leave a dent for a few moments. This dent is called pitting.  °CAUSES  °There are many possible causes of edema. Eating too much salt and being on your feet or sitting for a long time can cause edema in your legs and ankles. Hot weather may make edema worse. Common medical causes of edema include: °· Heart failure. °· Liver disease. °· Kidney disease. °· Weak blood vessels in your legs. °· Cancer. °· An injury. °· Pregnancy. °· Some medications. °· Obesity.  °SYMPTOMS  °Edema is usually painless. Your skin may look swollen or shiny.  °DIAGNOSIS  °Your health care provider may be able to diagnose edema by asking about your medical history and doing a physical exam. You may need to have tests such as X-rays, an electrocardiogram, or blood tests to check for medical conditions that may cause edema.  °TREATMENT  °Edema treatment depends on the cause. If you have heart, liver, or kidney disease, you need the treatment appropriate for these conditions. General treatment may include: °· Elevation of the affected body part above the level of your heart. °· Compression of the affected body part. Pressure from elastic bandages or support stockings squeezes the tissues and forces fluid back into the blood vessels. This keeps fluid from entering the tissues. °· Restriction of fluid and salt intake. °· Use of a water pill (diuretic). These medications are appropriate only for some types of edema. They pull fluid out of your body and make you urinate more often. This  gets rid of fluid and reduces swelling, but diuretics can have side effects. Only use diuretics as directed by your health care provider. °HOME CARE INSTRUCTIONS  °· Keep the affected body part above the level of your heart when you are lying down.   °· Do not sit still or stand for prolonged periods.   °· Do not put anything directly under your knees when lying down. °· Do not wear constricting clothing or garters on your upper legs.   °· Exercise your legs to work the fluid back into your blood vessels. This may help the swelling go down.   °· Wear elastic bandages or support stockings to reduce ankle swelling as directed by your health care provider.   °· Eat a low-salt diet to reduce fluid if your health care provider recommends it.   °· Only take medicines as directed by your health care provider.  °SEEK MEDICAL CARE IF:  °· Your edema is not responding to treatment. °· You have heart, liver, or kidney disease and notice symptoms of edema. °· You have edema in your legs that does not improve after elevating them.   °· You have sudden and unexplained weight gain. °SEEK IMMEDIATE MEDICAL CARE IF:  °· You develop shortness of breath or chest pain.   °· You cannot breathe when you lie down. °· You develop pain, redness, or warmth in the swollen areas.   °· You have heart, liver, or kidney disease and suddenly get edema. °· You have a fever and your symptoms suddenly get worse. °MAKE SURE YOU:  °·   Understand these instructions.  Will watch your condition.  Will get help right away if you are not doing well or get worse.   This information is not intended to replace advice given to you by your health care provider. Make sure you discuss any questions you have with your health care provider.   Document Released: 01/06/2005 Document Revised: 01/27/2014 Document Reviewed: 10/29/2012 Elsevier Interactive Patient Education 2016 [employer] Hypertension During Pregnancy Hypertension, or high blood pressure,  is when there is extra pressure inside your blood vessels that carry blood from the heart to the rest of your body (arteries). It can happen at any time in life, including pregnancy. Hypertension during pregnancy can cause problems for you and your baby. Your baby might not weigh as much as he or she should at birth or might be born early (premature). Very bad cases of hypertension during pregnancy can be life-threatening.  Different types of hypertension can occur during pregnancy. These include:  Chronic hypertension. This happens when a woman has hypertension before pregnancy and it continues during pregnancy.  Gestational hypertension. This is when hypertension develops during pregnancy.  Preeclampsia or toxemia of pregnancy. This is a very serious type of hypertension that develops only during pregnancy. It affects the whole body and can be very dangerous for both mother and baby.  Gestational hypertension and preeclampsia usually go away after your baby is born. Your blood pressure will likely stabilize within 6 weeks. Women who have hypertension during pregnancy have a greater chance of developing hypertension later in life or with future pregnancies. RISK FACTORS There are certain factors that make it more likely for you to develop hypertension during pregnancy. These include:  Having hypertension before pregnancy.  Having hypertension during a previous pregnancy.  Being overweight.  Being older than 40 years.  Being pregnant with more than one baby.  Having diabetes or kidney problems. SIGNS AND SYMPTOMS Chronic and gestational hypertension rarely cause symptoms. Preeclampsia has symptoms, which may include:  Increased protein in your urine. Your health care provider will check for this at every prenatal visit.  Swelling of your hands and face.  Rapid weight gain.  Headaches.  Visual changes.  Being bothered by light.  Abdominal pain, especially in the upper right  area.  Chest pain.  Shortness of breath.  Increased reflexes.  Seizures. These occur with a more severe form of preeclampsia, called eclampsia. DIAGNOSIS  You may be diagnosed with hypertension during a regular prenatal exam. At each prenatal visit, you may have:  Your blood pressure checked.  A urine test to check for protein in your urine. The type of hypertension you are diagnosed with depends on when you developed it. It also depends on your specific blood pressure reading.  Developing hypertension before 20 weeks of pregnancy is consistent with chronic hypertension.  Developing hypertension after 20 weeks of pregnancy is consistent with gestational hypertension.  Hypertension with increased urinary protein is diagnosed as preeclampsia.  Blood pressure measurements that stay above 160 systolic or 110 diastolic are a sign of severe preeclampsia. TREATMENT Treatment for hypertension during pregnancy varies. Treatment depends on the type of hypertension and how serious it is.  If you take medicine for chronic hypertension, you may need to switch medicines.  Medicines called ACE inhibitors should not be taken during pregnancy.  Low-dose aspirin may be suggested for women who have risk factors for preeclampsia.  If you have gestational hypertension, you may need to take a blood pressure medicine that is  safe during pregnancy. Your health care provider will recommend the correct medicine.  If you have severe preeclampsia, you may need to be in the hospital. Health care providers will watch you and your baby very closely. You also may need to take medicine called magnesium sulfate to prevent seizures and lower blood pressure.  Sometimes, an early delivery is needed. This may be the case if the condition worsens. It would be done to protect you and your baby. The only cure for preeclampsia is delivery.  Your health care provider may recommend that you take one low-dose aspirin (81  mg) each day to help prevent high blood pressure during your pregnancy if you are at risk for preeclampsia. You may be at risk for preeclampsia if:  You had preeclampsia or eclampsia during a previous pregnancy.  Your baby did not grow as expected during a previous pregnancy.  You experienced preterm birth with a previous pregnancy.  You experienced a separation of the placenta from the uterus (placental abruption) during a previous pregnancy.  You experienced the loss of your baby during a previous pregnancy.  You are pregnant with more than one baby.  You have other medical conditions, such as diabetes or an autoimmune disease. HOME CARE INSTRUCTIONS  Schedule and keep all of your regular prenatal care appointments. This is important.  Take medicines only as directed by your health care provider. Tell your health care provider about all medicines you take.  Eat as little salt as possible.  Get regular exercise.  Do not drink alcohol.  Do not use tobacco products.  Do not drink products with caffeine.  Lie on your left side when resting. SEEK IMMEDIATE MEDICAL CARE IF:  You have severe abdominal pain.  You have sudden swelling in your hands, ankles, or face.  You gain 4 pounds (1.8 kg) or more in 1 week.  You vomit repeatedly.  You have vaginal bleeding.  You do not feel your baby moving as much.  You have a headache.  You have blurred or double vision.  You have muscle twitching or spasms.  You have shortness of breath.  You have blue fingernails or lips.  You have blood in your urine. MAKE SURE YOU:  Understand these instructions.  Will watch your condition.  Will get help right away if you are not doing well or get worse.   This information is not intended to replace advice given to you by your health care provider. Make sure you discuss any questions you have with your health care provider.   Document Released: 09/24/2010 Document Revised:  01/27/2014 Document Reviewed: 08/05/2012 Elsevier Interactive Patient Education [employer]

## 2015-05-05 NOTE — MAU Note (Signed)
Pt c/o swelling in hands and face for several days now. Also has complaints of a HA that she rates 3/10 but has not taken anything for it. Also has some blurry vision. Took BP at home and was 128/71. Denies contractions, LOF or vag bleeding. +FM

## 2015-05-05 NOTE — MAU Provider Note (Signed)
Chief Complaint:  Headache and Facial Swelling   First Provider Initiated Contact with Patient 05/05/15 0059     HPI: April Frederick is a 30 y.o. G2P0010 at 16w2dwho presents to maternity admissions reporting swelling of feet and hands, causing fingers to be numb.  Also has had headache and visual changes she calls "fireworks" yesterday, not today. She reports good fetal movement, denies LOF, vaginal bleeding, vaginal itching/burning, urinary symptoms, h/a, dizziness, n/v, diarrhea, constipation or fever/chills.  She denies headache, visual changes or RUQ abdominal pain.   Hypertension This is a new problem. The current episode started yesterday. The problem is unchanged. Associated symptoms include blurred vision, headaches and peripheral edema. Pertinent negatives include no anxiety, chest pain, malaise/fatigue, palpitations, shortness of breath or sweats. There are no associated agents to hypertension. There are no known risk factors for coronary artery disease. Past treatments include nothing. There are no compliance problems.  late pregnancy.    Past Medical History: Past Medical History  Diagnosis Date  . Medical history non-contributory     Past obstetric history: OB History  Gravida Para Term Preterm AB SAB TAB Ectopic Multiple Living  0    # Outcome Date GA Lbr Len/2nd Weight Sex Delivery Anes PTL Lv  2 Current           1 Ectopic               Past Surgical History: Past Surgical History  Procedure Laterality Date  . No past surgeries      Family History: Family History  Problem Relation Age of Onset  . Diabetes Mother     type 2  . Cancer Paternal Aunt   . Diabetes Paternal Grandfather   . Cancer Paternal Grandfather   . Asthma Neg Hx     Social History: Social History  Substance Use Topics  . Smoking status: Former Games developer  . Smokeless tobacco: None     Comment: quit 2010  . Alcohol Use: No    Allergies: No Known Allergies  Meds:   Prescriptions prior to admission  Medication Sig Dispense Refill Last Dose  . Prenatal Vit-Fe Fumarate-FA (PRENATAL MULTIVITAMIN) TABS tablet Take 1 tablet by mouth daily.   05/04/2015 at 2000  . acetaminophen (TYLENOL) 500 MG tablet Take 1,000 mg by mouth every 6 (six) hours as needed for mild pain.   More than a month at Unknown time    I have reviewed patient's Past Medical Hx, Surgical Hx, Family Hx, Social Hx, medications and allergies.   ROS:  Review of Systems  Constitutional: Negative for fever, chills, malaise/fatigue and fatigue.  HENT: Negative for congestion.   Eyes: Positive for blurred vision and visual disturbance.  Respiratory: Negative for shortness of breath.   Cardiovascular: Negative for chest pain and palpitations.  Gastrointestinal: Negative for nausea, vomiting, abdominal pain, diarrhea and constipation.  Genitourinary: Negative for dysuria, vaginal bleeding, vaginal discharge and pelvic pain.  Musculoskeletal: Negative for back pain.  Neurological: Positive for headaches. Negative for dizziness.   Other systems negative  Physical Exam  Patient Vitals for the past 24 hrs:  BP Temp Temp src Pulse Resp SpO2 Height Weight  05/05/15 0055 140/81 mmHg 98.1 F (36.7 C) Oral 94 20 100 % - -  05/05/15 0045 - - - - - -  (1.651 m) 107.684 kg (237 lb 6.4 oz)   Filed Vitals:   05/05/15 0130 05/05/15 0145 05/05/15 0200 05/05/15 0215  BP: 118/67 126/78 125/71 124/67  Pulse: 77 79 80 75  Temp:      TempSrc:      Resp:      Height:      Weight:      SpO2:        Constitutional: Well-developed, well-nourished female in no acute distress.  Cardiovascular: normal rate and rhythm Respiratory: normal effort, clear to auscultation bilaterally GI: Abd soft, non-tender, gravid appropriate for gestational age.   No rebound or guarding. MS: Extremities nontender, 1+ edema of hands and feet, normal ROM Neurologic: Alert and oriented x 4.  DTRs 2+, no clonus GU: Neg  CVAT.  PELVIC EXAM:  deferred  FHT:  Baseline 140 , moderate variability, accelerations present, no decelerations Contractions:  Irregular    Labs: Results for orders placed or performed during the hospital encounter of 05/05/15 (from the past 24 hour(s))  Urinalysis, Routine w reflex microscopic (not at [facility])     Status: Abnormal   Collection Time: 05/05/15 12:42 AM  Result Value Ref Range   Color, Urine YELLOW YELLOW   APPearance CLEAR CLEAR   Specific Gravity, Urine <1.005 (L) 1.005 - 1.030   pH 5.5 5.0 - 8.0   Glucose, UA NEGATIVE NEGATIVE mg/dL   Hgb urine dipstick NEGATIVE NEGATIVE   Bilirubin Urine NEGATIVE NEGATIVE   Ketones, ur NEGATIVE NEGATIVE mg/dL   Protein, ur NEGATIVE NEGATIVE mg/dL   Nitrite NEGATIVE NEGATIVE   Leukocytes, UA NEGATIVE NEGATIVE  Protein / creatinine ratio, urine     Status: None   Collection Time: 05/05/15 12:42 AM  Result Value Ref Range   Creatinine, Urine 29.00 mg/dL   Total Protein, Urine <6 mg/dL   Protein Creatinine Ratio        0.00 - 0.15 mg/mg[Cre]  Comprehensive metabolic panel     Status: Abnormal   Collection Time: 05/05/15  1:11 AM  Result Value Ref Range   Sodium 137 135 - 145 mmol/L   Potassium 4.0 3.5 - 5.1 mmol/L   Chloride 106 101 - 111 mmol/L   CO2 24 22 - 32 mmol/L   Glucose, Bld 110 (H) 65 - 99 mg/dL   BUN 6 6 - 20 mg/dL   Creatinine, Ser 1.61 0.44 - 1.00 mg/dL   Calcium 9.2 8.9 - 09.6 mg/dL   Total Protein 6.1 (L) 6.5 - 8.1 g/dL   Albumin 2.8 (L) 3.5 - 5.0 g/dL   AST 17 15 - 41 U/L   ALT 19 14 - 54 U/L   Alkaline Phosphatase 71 38 - 126 U/L   Total Bilirubin 0.4 0.3 - 1.2 mg/dL   GFR calc non Af Amer >60 >60 mL/min   GFR calc Af Amer >60 >60 mL/min   Anion gap 7 5 - 15  CBC     Status: Abnormal   Collection Time: 05/05/15  1:11 AM  Result Value Ref Range   WBC 10.8 (H) 4.0 - 10.5 K/uL   RBC 3.90 3.87 - 5.11 MIL/uL   Hemoglobin 12.3 12.0 - 15.0 g/dL   HCT 04.5 (L) 40.9 - 81.1 %   MCV 91.5 78.0 - 100.0 fL    MCH 31.5 26.0 - 34.0 pg   MCHC 34.5 30.0 - 36.0 g/dL   RDW 91.4 78.2 - 95.6 %   Platelets 221 150 - 400 K/uL    Imaging:  No results found.  MAU Course/MDM: I have ordered labs and reviewed results.  NST reviewed Consult Dr Vincente Poli with presentation, exam findings  and test results.  Will discharge home with PIH precautions, instructed to return to office Monday, pt already has appt Monday Declined Tylenol for headache  Assessment: SIUP at 1115w2d  Labile hypertension Edema of extremities Mild headache  Plan: Discharge home Labor precautions and fetal kick counts Preeclampsia precautions Follow up in Office Monday for prenatal visit and recheck of vital signs    Medication List    ASK your doctor about these medications        acetaminophen 500 MG tablet  Commonly known as:  TYLENOL  Take 1,000 mg by mouth every 6 (six) hours as needed for mild pain.     prenatal multivitamin Tabs tablet  Take 1 tablet by mouth daily.       Pt stable at time of discharge.  Wynelle BourgeoisMarie Kassi Esteve CNM, MSN Certified Nurse-Midwife 05/05/2015 1:00 AM

## 2015-05-16 ENCOUNTER — Encounter (HOSPITAL_COMMUNITY): Payer: Self-pay | Admitting: *Deleted

## 2015-05-16 ENCOUNTER — Inpatient Hospital Stay (HOSPITAL_COMMUNITY)
Admission: AD | Admit: 2015-05-16 | Discharge: 2015-05-16 | Disposition: A | Discharge status: Home / Self Care | Payer: 59 | Source: Ambulatory Visit | Attending: Obstetrics and Gynecology | Admitting: Obstetrics and Gynecology

## 2015-05-16 DIAGNOSIS — Z3493 Encounter for supervision of normal pregnancy, unspecified, third trimester: Secondary | ICD-10-CM | POA: Diagnosis present

## 2015-05-16 HISTORY — DX: Unspecified ectopic pregnancy without intrauterine pregnancy: O00.90

## 2015-05-16 NOTE — MAU Note (Signed)
Contractions and back pain since during the night. No bleeding or leaking. Last exam in office was 1cm.

## 2015-05-16 NOTE — Discharge Instructions (Signed)
Keep your scheduled appointment for prenatal care. Call the office or provider on call with further concerns or return to MAU as needed. °

## 2015-05-23 ENCOUNTER — Telehealth (HOSPITAL_COMMUNITY): Payer: Self-pay | Admitting: *Deleted

## 2015-05-23 NOTE — Telephone Encounter (Signed)
Preadmission screen  

## 2015-05-25 ENCOUNTER — Encounter (HOSPITAL_COMMUNITY): Payer: Self-pay | Admitting: *Deleted

## 2015-05-25 ENCOUNTER — Telehealth (HOSPITAL_COMMUNITY): Payer: Self-pay | Admitting: *Deleted

## 2015-05-25 MED ORDER — ZOLPIDEM TARTRATE 5 MG PO TABS
5.0000 mg | ORAL_TABLET | Freq: Every evening | ORAL | Status: DC | PRN
  Filled 2015-05-25: qty 1

## 2015-05-25 NOTE — Telephone Encounter (Signed)
Preadmission screen  

## 2015-05-26 ENCOUNTER — Inpatient Hospital Stay (HOSPITAL_COMMUNITY)
Admission: AD | Admit: 2015-05-26 | Discharge: 2015-05-28 | DRG: 775 | Disposition: A | Discharge status: Home / Self Care | Payer: 59 | Source: Ambulatory Visit | Attending: Obstetrics and Gynecology | Admitting: Obstetrics and Gynecology

## 2015-05-26 ENCOUNTER — Inpatient Hospital Stay (HOSPITAL_COMMUNITY): Payer: 59 | Admitting: Anesthesiology

## 2015-05-26 ENCOUNTER — Encounter (HOSPITAL_COMMUNITY): Payer: Self-pay | Admitting: *Deleted

## 2015-05-26 DIAGNOSIS — Z809 Family history of malignant neoplasm, unspecified: Secondary | ICD-10-CM

## 2015-05-26 DIAGNOSIS — Z833 Family history of diabetes mellitus: Secondary | ICD-10-CM | POA: Diagnosis not present

## 2015-05-26 DIAGNOSIS — Z87891 Personal history of nicotine dependence: Secondary | ICD-10-CM

## 2015-05-26 HISTORY — DX: Herpesviral infection, unspecified: B00.9

## 2015-05-26 LAB — CBC
HCT: 37.8 % (ref 36.0–46.0)
Hemoglobin: 13.2 g/dL (ref 12.0–15.0)
MCH: 31.7 pg (ref 26.0–34.0)
MCHC: 34.9 g/dL (ref 30.0–36.0)
MCV: 90.9 fL (ref 78.0–100.0)
PLATELETS: 240 10*3/uL (ref 150–400)
RBC: 4.16 MIL/uL (ref 3.87–5.11)
RDW: 13.8 % (ref 11.5–15.5)
WBC: 11.5 10*3/uL — AB (ref 4.0–10.5)

## 2015-05-26 LAB — TYPE AND SCREEN
ABO/RH(D): A POS
Antibody Screen: NEGATIVE

## 2015-05-26 MED ORDER — LIDOCAINE HCL (PF) 1 % IJ SOLN
30.0000 mL | INTRAMUSCULAR | Status: DC | PRN
  Filled 2015-05-26: qty 30

## 2015-05-26 MED ORDER — EPHEDRINE 5 MG/ML INJ
10.0000 mg | INTRAVENOUS | Status: DC | PRN
  Filled 2015-05-26: qty 2

## 2015-05-26 MED ORDER — PHENYLEPHRINE 40 MCG/ML (10ML) SYRINGE FOR IV PUSH (FOR BLOOD PRESSURE SUPPORT)
80.0000 ug | PREFILLED_SYRINGE | INTRAVENOUS | Status: DC | PRN
  Filled 2015-05-26: qty 5

## 2015-05-26 MED ORDER — OXYTOCIN BOLUS FROM INFUSION
500.0000 mL | INTRAVENOUS | Status: DC
  Administered 2015-05-26: 500 mL via INTRAVENOUS

## 2015-05-26 MED ORDER — OXYCODONE-ACETAMINOPHEN 5-325 MG PO TABS: 2.0000 | ORAL_TABLET | ORAL | Status: DC | PRN

## 2015-05-26 MED ORDER — CITRIC ACID-SODIUM CITRATE 334-500 MG/5ML PO SOLN: 30.0000 mL | ORAL | Status: DC | PRN

## 2015-05-26 MED ORDER — LACTATED RINGERS IV SOLN
500.0000 mL | Freq: Once | INTRAVENOUS | Status: AC
  Administered 2015-05-26: 500 mL via INTRAVENOUS

## 2015-05-26 MED ORDER — LACTATED RINGERS IV SOLN
INTRAVENOUS | Status: DC
  Administered 2015-05-26 (×2): via INTRAVENOUS

## 2015-05-26 MED ORDER — ONDANSETRON HCL 4 MG/2ML IJ SOLN: 4.0000 mg | Freq: Four times a day (QID) | INTRAMUSCULAR | Status: DC | PRN

## 2015-05-26 MED ORDER — FENTANYL 2.5 MCG/ML BUPIVACAINE 1/10 % EPIDURAL INFUSION (WH - ANES)
14.0000 mL/h | INTRAMUSCULAR | Status: DC | PRN
  Administered 2015-05-26: 14 mL/h via EPIDURAL
  Filled 2015-05-26: qty 125

## 2015-05-26 MED ORDER — TERBUTALINE SULFATE 1 MG/ML IJ SOLN
0.2500 mg | Freq: Once | INTRAMUSCULAR | Status: DC | PRN
  Filled 2015-05-26: qty 1

## 2015-05-26 MED ORDER — ACETAMINOPHEN 325 MG PO TABS: 650.0000 mg | ORAL_TABLET | ORAL | Status: DC | PRN

## 2015-05-26 MED ORDER — OXYTOCIN 10 UNIT/ML IJ SOLN
1.0000 m[IU]/min | INTRAVENOUS | Status: DC
  Administered 2015-05-26: 2 m[IU]/min via INTRAVENOUS

## 2015-05-26 MED ORDER — LACTATED RINGERS IV SOLN
500.0000 mL | INTRAVENOUS | Status: DC | PRN
  Administered 2015-05-26: 100 mL via INTRAVENOUS
  Administered 2015-05-27: 150 mL via INTRAVENOUS

## 2015-05-26 MED ORDER — PHENYLEPHRINE 40 MCG/ML (10ML) SYRINGE FOR IV PUSH (FOR BLOOD PRESSURE SUPPORT)
80.0000 ug | PREFILLED_SYRINGE | INTRAVENOUS | Status: DC | PRN
  Filled 2015-05-26: qty 5
  Filled 2015-05-26: qty 10

## 2015-05-26 MED ORDER — OXYTOCIN 10 UNIT/ML IJ SOLN
2.5000 [IU]/h | INTRAVENOUS | Status: DC
  Filled 2015-05-26: qty 4

## 2015-05-26 MED ORDER — LIDOCAINE HCL (PF) 1 % IJ SOLN
INTRAMUSCULAR | Status: DC | PRN
  Administered 2015-05-26: 2 mL via EPIDURAL
  Administered 2015-05-26: 3 mL via EPIDURAL
  Administered 2015-05-26: 5 mL via EPIDURAL

## 2015-05-26 MED ORDER — OXYCODONE-ACETAMINOPHEN 5-325 MG PO TABS: 1.0000 | ORAL_TABLET | ORAL | Status: DC | PRN

## 2015-05-26 MED ORDER — LACTATED RINGERS IV SOLN: 500.0000 mL | Freq: Once | INTRAVENOUS | Status: DC

## 2015-05-26 MED ORDER — BUTORPHANOL TARTRATE 1 MG/ML IJ SOLN
1.0000 mg | INTRAMUSCULAR | Status: DC | PRN
  Filled 2015-05-26: qty 1

## 2015-05-26 MED ORDER — DIPHENHYDRAMINE HCL 50 MG/ML IJ SOLN
12.5000 mg | INTRAMUSCULAR | Status: DC | PRN
  Administered 2015-05-26: 12.5 mg via INTRAVENOUS
  Filled 2015-05-26: qty 1

## 2015-05-26 NOTE — MAU Note (Signed)
Patient not in lobby when called. Husband said she walked down the hall.

## 2015-05-26 NOTE — MAU Provider Note (Signed)
Suspected ROM - needs speculum exam to determine.  Vaginal pooling large amount immediate with placement of speculum.  Fern slide obtained for [employer].  Nolene Bernheimerri Lois Slagel, NP

## 2015-05-26 NOTE — Anesthesia Procedure Notes (Signed)
Epidural Patient location during procedure: OB  Staffing Anesthesiologist: Luvern Mischke EDWARD Performed by: anesthesiologist   Preanesthetic Checklist Completed: patient identified, pre-op evaluation, timeout performed, IV checked, risks and benefits discussed and monitors and equipment checked  Epidural Patient position: sitting Prep: DuraPrep Patient monitoring: blood pressure and continuous pulse ox Approach: midline Location: L3-L4 Injection technique: LOR air  Needle:  Needle type: Tuohy  Needle gauge: 17 G Needle length: 9 cm Needle insertion depth: 6 cm Catheter size: 19 Gauge Catheter at skin depth: 11 cm Test dose: negative and Other (1% Lidocaine)  Additional Notes Patient identified.  Risk benefits discussed including failed block, incomplete pain control, headache, nerve damage, paralysis, blood pressure changes, nausea, vomiting, reactions to medication both toxic or allergic, and postpartum back pain.  Patient expressed understanding and wished to proceed.  All questions were answered.  Sterile technique used throughout procedure and epidural site dressed with sterile barrier dressing. No paresthesia or other complications noted. The patient did not experience any signs of intravascular injection such as tinnitus or metallic taste in mouth nor signs of intrathecal spread such as rapid motor block. Please see nursing notes for vital signs. Reason for block:procedure for pain   

## 2015-05-26 NOTE — MAU Note (Signed)
Leaking fluid since 0430.

## 2015-05-26 NOTE — Anesthesia Preprocedure Evaluation (Signed)
Anesthesia Evaluation  Patient identified by MRN, date of birth, ID band Patient awake    Reviewed: Allergy & Precautions, NPO status , Patient's Chart, lab work & pertinent test results  Airway Mallampati: II  TM Distance: >3 FB Neck ROM: Full    Dental  (+) Teeth Intact, Dental Advisory Given   Pulmonary former smoker,    Pulmonary exam normal breath sounds clear to auscultation       Cardiovascular negative cardio ROS Normal cardiovascular exam Rhythm:Regular Rate:Normal     Neuro/Psych negative neurological ROS     GI/Hepatic negative GI ROS, Neg liver ROS,   Endo/Other  Morbid obesity  Renal/GU negative Renal ROS     Musculoskeletal negative musculoskeletal ROS (+)   Abdominal   Peds  Hematology negative hematology ROS (+) Plt 240k    Anesthesia Other Findings Day of surgery medications reviewed with the patient.  Reproductive/Obstetrics (+) Pregnancy                             Anesthesia Physical Anesthesia Plan  ASA: III  Anesthesia Plan: Epidural   Post-op Pain Management:    Induction:   Airway Management Planned:   Additional Equipment:   Intra-op Plan:   Post-operative Plan:   Informed Consent: I have reviewed the patients History and Physical, chart, labs and discussed the procedure including the risks, benefits and alternatives for the proposed anesthesia with the patient or authorized representative who has indicated his/her understanding and acceptance.   Dental advisory given  Plan Discussed with:   Anesthesia Plan Comments: (Patient identified. Risks/Benefits/Options discussed with patient including but not limited to bleeding, infection, nerve damage, paralysis, failed block, incomplete pain control, headache, blood pressure changes, nausea, vomiting, reactions to medication both or allergic, itching and postpartum back pain. Confirmed with bedside nurse  the patient's most recent platelet count. Confirmed with patient that they are not currently taking any anticoagulation, have any bleeding history or any family history of bleeding disorders. Patient expressed understanding and wished to proceed. All questions were answered. )        Anesthesia Quick Evaluation

## 2015-05-26 NOTE — Anesthesia Pain Management Evaluation Note (Signed)
  CRNA Pain Management Visit Note  Patient: April Frederick, 30 y.o., female  "Hello I am a [facility]. We have an anesthesia team available at all times to provide care throughout the hospital, including epidural management and anesthesia for C-section. I don't know your plan for the delivery whether it a natural birth, water birth, IV sedation, nitrous supplementation, doula or epidural, but we want to meet your pain goals."   1.Was your pain managed to your expectations on prior hospitalizations?   NA  2.What is your expectation for pain management during this hospitalization?  Epidural     3.How can we help you reach that goal?epidural  Record the patient's initial score and the patient's pain goal.   Pain:   6  Pain Goal: 8  [facility] wants you to be able to say your pain was always managed very well.  [facility] Se[facility] HospitalWRINKLE,Shanen Norris 05/26/2015

## 2015-05-26 NOTE — H&P (Signed)
April Frederick is a 30 y.o. G 3 P 0020 at 40 w 2 days presents with SROM.   Maternal Medical History:  Reason for admission: Rupture of membranes and contractions.     OB History    Gravida Para Term Preterm AB TAB SAB Ectopic Multiple Living   3    2 1  1   0     Past Medical History  Diagnosis Date  . Ectopic pregnancy     Had MTX X 2  . Herpes    Past Surgical History  Procedure Laterality Date  . No past surgeries    . Other surgical history      [facility] does not remember what  it was about.   Family History: family history includes Cancer in her paternal aunt and paternal grandfather; Diabetes in her mother and paternal grandfather. There is no history of Asthma. Social History:  reports that she has quit smoking. She does not have any smokeless tobacco history on file. She reports that she does not drink alcohol or use illicit drugs.   Prenatal Transfer Tool  Maternal Diabetes: No Genetic Screening: Normal Maternal Ultrasounds/Referrals: Normal Fetal Ultrasounds or other Referrals:  None Maternal Substance Abuse:  No Significant Maternal Medications:  None Significant Maternal Lab Results:  None Other Comments:  None  Review of Systems  All other systems reviewed and are negative.   Dilation: 2 Effacement (%): 70 Station: -3 Exam by:: Lajuana Matteina Jacobs, RN Blood pressure 128/81, pulse 73, temperature 97.8 F (36.6 C), temperature source Oral, resp. rate 18, height 5\' 5"  (1.651 m), weight 108.41 kg (239 lb), unknown if currently breastfeeding. Maternal Exam:  Abdomen: Fetal presentation: vertex     Fetal Exam Fetal State Assessment: Category I - tracings are normal.     Physical Exam  Nursing note and vitals reviewed. Constitutional: She appears well-developed and well-nourished.  HENT:  Head: Normocephalic.  Eyes: Pupils are equal, round, and reactive to light.  Neck: Normal range of motion.  Cardiovascular:  Normal rate and regular rhythm.     Prenatal labs: ABO, Rh: --/--/A POS (05/06 [id number]) Antibody: NEG (05/06 0852) Rubella: Immune, Immune (09/29 0000) RPR: Nonreactive, Nonreactive (09/29 0000)  HBsAg: Negative, Negative (09/29 0000)  HIV: Non-reactive, Non-reactive (09/29 0000)  GBS: Negative (04/04 0000)   Assessment/Plan: IUP at 40 w 2 days SROM Labor Epidural  Pitocin   Dionisia Pacholski,Aurilla L 05/26/2015, 3:49 PM

## 2015-05-27 ENCOUNTER — Encounter (HOSPITAL_COMMUNITY): Payer: Self-pay

## 2015-05-27 ENCOUNTER — Inpatient Hospital Stay (HOSPITAL_COMMUNITY): Payer: 59

## 2015-05-27 LAB — CBC
HCT: 34.8 % — ABNORMAL LOW (ref 36.0–46.0)
Hemoglobin: 11.8 g/dL — ABNORMAL LOW (ref 12.0–15.0)
MCH: 30.6 pg (ref 26.0–34.0)
MCHC: 33.9 g/dL (ref 30.0–36.0)
MCV: 90.2 fL (ref 78.0–100.0)
PLATELETS: 228 10*3/uL (ref 150–400)
RBC: 3.86 MIL/uL — ABNORMAL LOW (ref 3.87–5.11)
RDW: 13.9 % (ref 11.5–15.5)
WBC: 17 10*3/uL — AB (ref 4.0–10.5)

## 2015-05-27 LAB — RPR: RPR Ser Ql: NONREACTIVE

## 2015-05-27 LAB — HIV ANTIBODY (ROUTINE TESTING W REFLEX): HIV SCREEN 4TH GENERATION: NONREACTIVE

## 2015-05-27 MED ORDER — IBUPROFEN 600 MG PO TABS
600.0000 mg | ORAL_TABLET | Freq: Four times a day (QID) | ORAL | Status: DC
  Administered 2015-05-27 – 2015-05-28 (×6): 600 mg via ORAL
  Filled 2015-05-27 (×6): qty 1

## 2015-05-27 MED ORDER — DIPHENHYDRAMINE HCL 25 MG PO CAPS: 25.0000 mg | ORAL_CAPSULE | Freq: Four times a day (QID) | ORAL | Status: DC | PRN

## 2015-05-27 MED ORDER — COCONUT OIL OIL: 1.0000 "application " | TOPICAL_OIL | Status: DC | PRN

## 2015-05-27 MED ORDER — PRENATAL MULTIVITAMIN CH
1.0000 | ORAL_TABLET | Freq: Every day | ORAL | Status: DC
  Administered 2015-05-27 – 2015-05-28 (×2): 1 via ORAL
  Filled 2015-05-27 (×2): qty 1

## 2015-05-27 MED ORDER — TETANUS-DIPHTH-ACELL PERTUSSIS 5-2.5-18.5 LF-MCG/0.5 IM SUSP: 0.5000 mL | Freq: Once | INTRAMUSCULAR | Status: DC

## 2015-05-27 MED ORDER — ZOLPIDEM TARTRATE 5 MG PO TABS: 5.0000 mg | ORAL_TABLET | Freq: Every evening | ORAL | Status: DC | PRN

## 2015-05-27 MED ORDER — BISACODYL 10 MG RE SUPP
10.0000 mg | Freq: Every day | RECTAL | Status: DC | PRN
Start: 2015-05-27 — End: 2015-05-28

## 2015-05-27 MED ORDER — SIMETHICONE 80 MG PO CHEW: 80.0000 mg | CHEWABLE_TABLET | ORAL | Status: DC | PRN

## 2015-05-27 MED ORDER — ONDANSETRON HCL 4 MG PO TABS: 4.0000 mg | ORAL_TABLET | ORAL | Status: DC | PRN

## 2015-05-27 MED ORDER — SENNOSIDES-DOCUSATE SODIUM 8.6-50 MG PO TABS: 2.0000 | ORAL_TABLET | ORAL | Status: DC

## 2015-05-27 MED ORDER — WITCH HAZEL-GLYCERIN EX PADS: 1.0000 "application " | MEDICATED_PAD | CUTANEOUS | Status: DC | PRN

## 2015-05-27 MED ORDER — FLEET ENEMA 7-19 GM/118ML RE ENEM
1.0000 | ENEMA | Freq: Every day | RECTAL | Status: DC | PRN
Start: 2015-05-27 — End: 2015-05-28

## 2015-05-27 MED ORDER — MEDROXYPROGESTERONE ACETATE 150 MG/ML IM SUSP: 150.0000 mg | INTRAMUSCULAR | Status: DC | PRN

## 2015-05-27 MED ORDER — ACETAMINOPHEN 325 MG PO TABS
650.0000 mg | ORAL_TABLET | ORAL | Status: DC | PRN
  Administered 2015-05-27 – 2015-05-28 (×3): 650 mg via ORAL
  Filled 2015-05-27 (×3): qty 2

## 2015-05-27 MED ORDER — DIBUCAINE 1 % RE OINT: 1.0000 "application " | TOPICAL_OINTMENT | RECTAL | Status: DC | PRN

## 2015-05-27 MED ORDER — MEASLES, MUMPS & RUBELLA VAC ~~LOC~~ INJ
0.5000 mL | INJECTION | Freq: Once | SUBCUTANEOUS | Status: DC
  Filled 2015-05-27: qty 0.5

## 2015-05-27 MED ORDER — BENZOCAINE-MENTHOL 20-0.5 % EX AERO
1.0000 "application " | INHALATION_SPRAY | CUTANEOUS | Status: DC | PRN
  Administered 2015-05-27 – 2015-05-28 (×2): 1 via TOPICAL
  Filled 2015-05-27 (×2): qty 56

## 2015-05-27 MED ORDER — ONDANSETRON HCL 4 MG/2ML IJ SOLN: 4.0000 mg | INTRAMUSCULAR | Status: DC | PRN

## 2015-05-27 NOTE — Lactation Note (Signed)
This note was copied from a baby's chart. Lactation Consultation Note; Baby unwrapped and diaper changed. Assisted mom with football hold. Baby took a few sucks on and off then went to sleep. Reviewed basic teaching with mom. No further questions at present. Encouragement given.   Patient Name: April Chelsea AusMichelle Frederick ZOXWR'UToday's Date: 05/27/2015 Reason for consult: Follow-up assessment   Maternal Data Formula Feeding for Exclusion: No Has patient been taught Hand Expression?: Yes Does the patient have breastfeeding experience prior to this delivery?: No  Feeding Feeding Type: Breast Fed Length of feed: 5 min  LATCH Score/Interventions Latch: Repeated attempts needed to sustain latch, nipple held in mouth throughout feeding, stimulation needed to elicit sucking reflex. (few sucks)  Audible Swallowing: None  Type of Nipple: Everted at rest and after stimulation  Comfort (Breast/Nipple): Soft / non-tender     Hold (Positioning): Assistance needed to correctly position infant at breast and maintain latch. Intervention(s): Breastfeeding basics reviewed;Skin to skin;Position options  LATCH Score: 6  Lactation Tools Discussed/Used     Consult Status Consult Status: Follow-up Date: 05/28/15 Follow-up type: In-patient    Pamelia HoitWeeks, Lynde Ludwig D 05/27/2015, 4:50 PM

## 2015-05-27 NOTE — Anesthesia Postprocedure Evaluation (Signed)
Anesthesia Post Note  Patient: Chelsea AusMichelle Lopata  Procedure(s) Performed: * No procedures listed *  Patient location during evaluation: Mother Baby Anesthesia Type: Epidural Level of consciousness: awake and alert, oriented and patient cooperative Pain management: pain level controlled Vital Signs Assessment: post-procedure vital signs reviewed and stable Respiratory status: spontaneous breathing Cardiovascular status: stable Postop Assessment: no headache, epidural receding, patient able to bend at knees and no signs of nausea or vomiting Anesthetic complications: no Comments: Denies pain.     Last Vitals:  Filed Vitals:   05/27/15 0310 05/27/15 0605  BP: 110/67 128/77  Pulse: 93 87  Temp: 36.5 C 36.8 C  Resp: 18 18    Last Pain:  Filed Vitals:   05/27/15 0757  PainSc: 0-No pain   Pain Goal: Patients Stated Pain Goal: 0 (05/26/15 1115)               Merrilyn PumaWRINKLE,[place]

## 2015-05-27 NOTE — Lactation Note (Signed)
This note was copied from a baby's chart. Lactation Consultation Note; Mom resting and baby asleep at present. Asked that I come back at 4. Reports baby nurse well at 11:30 this morning. Has been sleepy since delivery. Reviewed normal behavior the first 24 hours and feeding cues. BF brochure given with resources for support after DC. Reviewed OP appointments and BFSG.  States she needs hep getting baby positioned at the breast  Patient Name: April Frederick MVHQI'OToday's Date: 05/27/2015 Reason for consult: Initial assessment   Maternal Data Formula Feeding for Exclusion: No Does the patient have breastfeeding experience prior to this delivery?: No  Feeding  LATCH Score/Interventions                      Lactation Tools Discussed/Used     Consult Status Consult Status: Follow-up Date: 05/27/15 Follow-up type: In-patient    Pamelia HoitWeeks, Chalisa Kobler D 05/27/2015, 2:49 PM

## 2015-05-27 NOTE — Lactation Note (Signed)
This note was copied from a baby's chart. Lactation Consultation Note Follow up visit at 22 hours of age.  Mom requesting assist with latching.  Mom is concerned that baby may still be hungry because she recently fed her but is showing feeding cues.  Discussed cluster feedings.  Assisted with cross cradle hold and mom did not like it.  Mom repositioned to football and latched with minimal help.  Baby sleepy and needs stimulation to maintain feeding.  Mom to call for assist as needed.   Patient Name: April Chelsea AusMichelle Dibari ZOXWR'UToday's Date: 05/27/2015 Reason for consult: Follow-up assessment   Maternal Data    Feeding Feeding Type: Breast Fed Length of feed:  (several minutes observed)  LATCH Score/Interventions Latch: Repeated attempts needed to sustain latch, nipple held in mouth throughout feeding, stimulation needed to elicit sucking reflex. Intervention(s): Adjust position;Assist with latch;Breast massage;Breast compression  Audible Swallowing: A few with stimulation Intervention(s): Skin to skin;Hand expression  Type of Nipple: Everted at rest and after stimulation  Comfort (Breast/Nipple): Soft / non-tender     Hold (Positioning): Assistance needed to correctly position infant at breast and maintain latch. Intervention(s): Breastfeeding basics reviewed;Support Pillows;Position options;Skin to skin  LATCH Score: 7  Lactation Tools Discussed/Used     Consult Status Consult Status: Follow-up Date: 05/28/15 Follow-up type: In-patient    [name], [name] 05/27/2015, 10:19 PM

## 2015-05-27 NOTE — Progress Notes (Signed)
Patient is doing well.  No complaints.  BP 110/67 mmHg  Pulse 93  Temp(Src) 97.7 F (36.5 C) (Oral)  Resp 18  Ht 5\' 5"  (1.651 m)  Wt 108.41 kg (239 lb)  BMI 39.77 kg/m2  SpO2 100%  Breastfeeding? Unknown Results for orders placed or performed during the hospital encounter of 05/26/15 (from the past 24 hour(s))  CBC     Status: Abnormal   Collection Time: 05/26/15  8:52 AM  Result Value Ref Range   WBC 11.5 (H) 4.0 - 10.5 K/uL   RBC 4.16 3.87 - 5.11 MIL/uL   Hemoglobin 13.2 12.0 - 15.0 g/dL   HCT 16.137.8 09.636.0 - 04.546.0 %   MCV 90.9 78.0 - 100.0 fL   MCH 31.7 26.0 - 34.0 pg   MCHC 34.9 30.0 - 36.0 g/dL   RDW 40.913.8 81.111.5 - 91.415.5 %   Platelets 240 150 - 400 K/uL  Type and screen [facility]     Status: None   Collection Time: 05/26/15  8:52 AM  Result Value Ref Range   ABO/RH(D) A POS    Antibody Screen NEG    Sample Expiration 05/29/2015   CBC     Status: Abnormal   Collection Time: 05/27/15  5:30 AM  Result Value Ref Range   WBC 17.0 (H) 4.0 - 10.5 K/uL   RBC 3.86 (L) 3.87 - 5.11 MIL/uL   Hemoglobin 11.8 (L) 12.0 - 15.0 g/dL   HCT 78.234.8 (L) 95.636.0 - 21.346.0 %   MCV 90.2 78.0 - 100.0 fL   MCH 30.6 26.0 - 34.0 pg   MCHC 33.9 30.0 - 36.0 g/dL   RDW 08.613.9 57.811.5 - 46.915.5 %   Platelets 228 150 - 400 K/uL   Abdomen is soft and non tender Lochia WNL  IMPRESSION: PPD #1 Doing well  Routine care  PLAN: Discharge on Monday

## 2015-05-28 ENCOUNTER — Inpatient Hospital Stay (HOSPITAL_COMMUNITY)
Admission: RE | Admit: 2015-05-28 | Discharge: 2015-05-28 | Disposition: A | Discharge status: Home / Self Care | Payer: 59 | Source: Ambulatory Visit | Attending: Obstetrics and Gynecology | Admitting: Obstetrics and Gynecology

## 2015-05-28 MED ORDER — IBUPROFEN 600 MG PO TABS: 600.0000 mg | ORAL_TABLET | Freq: Four times a day (QID) | ORAL | Status: DC | PRN

## 2015-05-28 MED ORDER — ACETAMINOPHEN 325 MG PO TABS: 650.0000 mg | ORAL_TABLET | Freq: Four times a day (QID) | ORAL | Status: DC | PRN

## 2015-05-28 NOTE — Lactation Note (Signed)
This note was copied from a baby's chart. Lactation Consultation Note  Patient Name: April Chelsea AusMichelle Posa BMWUX'LToday's Date: 05/28/2015   Visited with Mom and FOB on day of discharge, baby 3435 hrs old.  Baby is more hungry acting, but she still needs to be awakened for most feedings.  Encouraged her to keep baby skin to skin as much as she can, and feed her often on cue.  Talked about basics of first days home from hospital.  Mom describes a deep, wide latch, feeling uterine contractions, and sleepiness during feedings.  Reminded Mom of OP Lactation services available to her, and encouraged her to call prn.     Judee ClaraSmith, Nattalie Santiesteban E 05/28/2015, 10:38 AM

## 2015-05-28 NOTE — Progress Notes (Signed)
CSW received consult for Anxiety.  CSW reviewed MOB's medical record and does not see An[facility] or Nursing Ad[facility] Summary.  CSW sees no documentation from RN staff regarding Anxiety during postpartum stay.  Therefore, CSW is screening out referral.

## 2015-05-28 NOTE — Discharge Summary (Signed)
Obstetric Discharge Summary Reason for Admission: onset of labor Prenatal Procedures: ultrasound Intrapartum Procedures: spontaneous vaginal delivery Postpartum Procedures: none Complications-Operative and Postpartum: none HEMOGLOBIN  Date Value Ref Range Status  05/27/2015 11.8* 12.0 - 15.0 g/dL Final   HCT  Date Value Ref Range Status  05/27/2015 34.8* 36.0 - 46.0 % Final    Physical Exam:  General: alert, cooperative and no distress Lochia: appropriate Uterine Fundus: firm Incision: healing well DVT Evaluation: No evidence of DVT seen on physical exam.  Discharge Diagnoses: Term Pregnancy-delivered  Discharge Information: Date: 05/28/2015 Activity: pelvic rest Diet: routine Medications: PNV, Ibuprofen and acetominophen Condition: mother Instructions: refer to practice specific booklet Discharge to: home   Newborn Data: Live born female  Birth Weight: 7 lb 6.9 oz (3371 g) APGAR: 8, 9  Home with mother.  April Frederick II,April Frederick E 05/28/2015, 8:08 AM

## 2015-05-28 NOTE — Progress Notes (Signed)
Post Partum Day 2 Subjective: no complaints, up ad lib, voiding, tolerating PO and + flatus  Objective: Blood pressure 109/51, pulse 55, temperature 97.8 F (36.6 C), temperature source Oral, resp. rate 20, height 5\' 5"  (1.651 m), weight 239 lb (108.41 kg), SpO2 94 %, unknown if currently breastfeeding.  Physical Exam:  General: alert, cooperative and no distress Lochia: appropriate Uterine Fundus: firm Incision: healing well DVT Evaluation: No evidence of DVT seen on physical exam.   Recent Labs  05/26/15 0852 05/27/15 0530  HGB 13.2 11.8*  HCT 37.8 34.8*    Assessment/Plan: Discharge home   LOS: 2 days   Karandeep Resende II,Jeffren Dombek E 05/28/2015, 8:06 AM

## 2015-05-28 NOTE — Discharge Instructions (Signed)
No vaginal entry No operation automobiles
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
## 2016-06-03 ENCOUNTER — Other Ambulatory Visit: Payer: Self-pay | Admitting: Internal Medicine

## 2016-06-03 DIAGNOSIS — R109 Unspecified abdominal pain: Secondary | ICD-10-CM

## 2016-06-04 ENCOUNTER — Ambulatory Visit: Payer: 59

## 2016-06-04 ENCOUNTER — Ambulatory Visit
Admission: RE | Admit: 2016-06-04 | Discharge: 2016-06-04 | Disposition: A | Discharge status: Home / Self Care | Payer: 59 | Source: Ambulatory Visit | Attending: Internal Medicine | Admitting: Internal Medicine

## 2016-06-04 ENCOUNTER — Other Ambulatory Visit: Payer: Self-pay | Admitting: Internal Medicine

## 2016-06-04 DIAGNOSIS — R109 Unspecified abdominal pain: Secondary | ICD-10-CM | POA: Diagnosis not present

## 2016-06-05 ENCOUNTER — Ambulatory Visit: Payer: 59

## 2016-06-24 DIAGNOSIS — R072 Precordial pain: Secondary | ICD-10-CM | POA: Insufficient documentation

## 2016-08-15 ENCOUNTER — Ambulatory Visit: Payer: 59 | Admitting: Cardiovascular Disease

## 2018-08-16 IMAGING — US US ABDOMEN COMPLETE
1 series · 14 of 25 positions shown · non-contrast
Comparison: None.

CLINICAL DATA: Generalized abdominal pain for 3 months

EXAM:
ABDOMEN ULTRASOUND COMPLETE

[Series 1: us abdomen complete · 0.22mm/px · 14 of 76 slices shown]
[im 1/76]
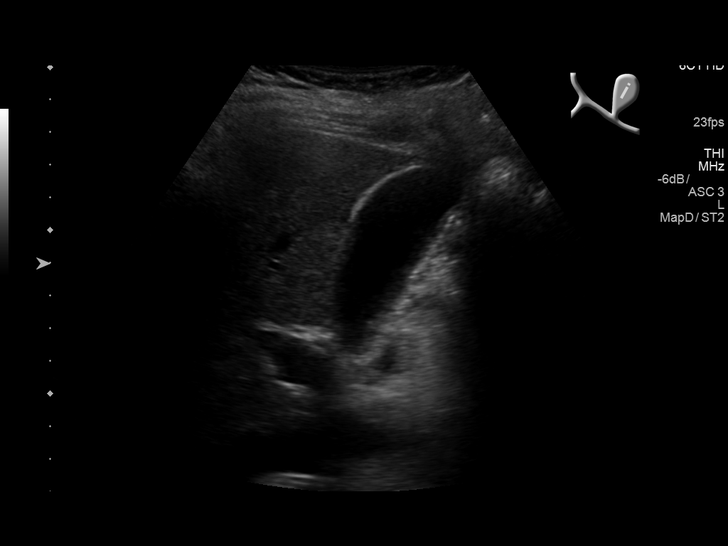
[im 7/76]
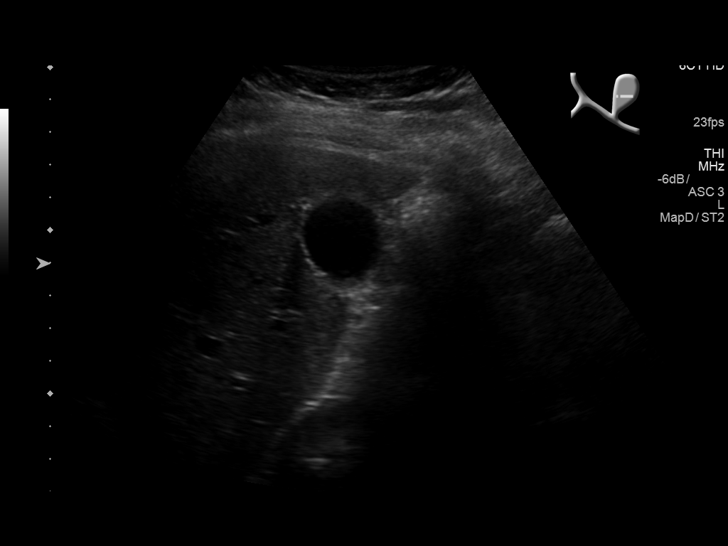
[im 13/76]
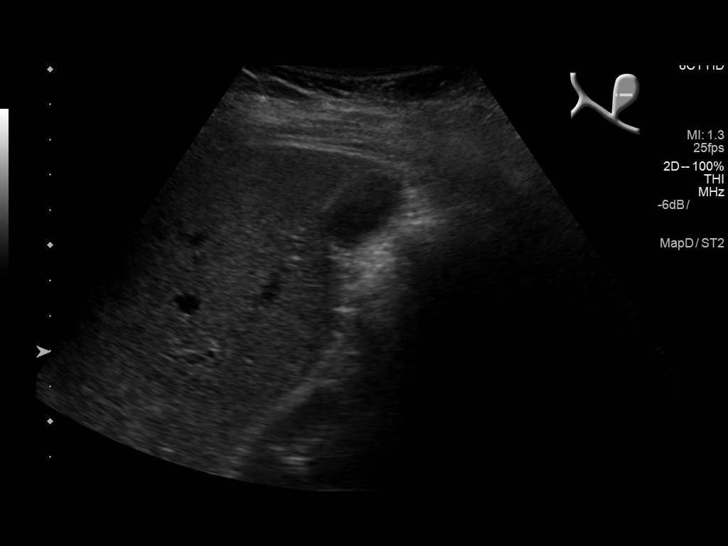
[im 19/76]
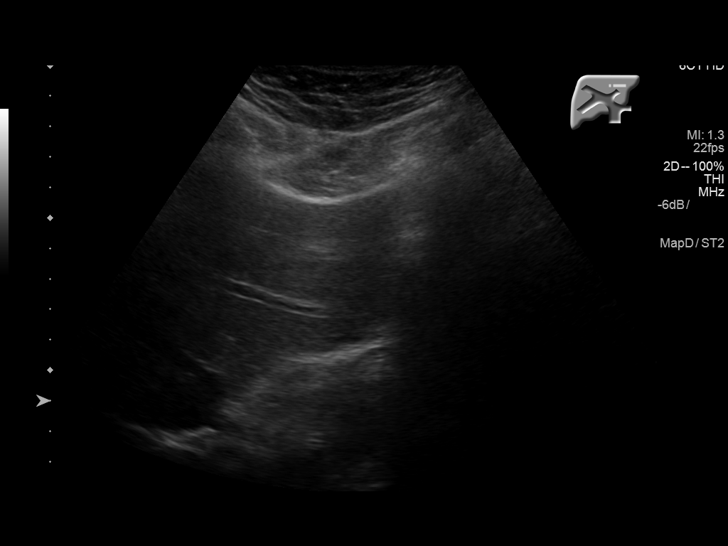
[im 26/76]
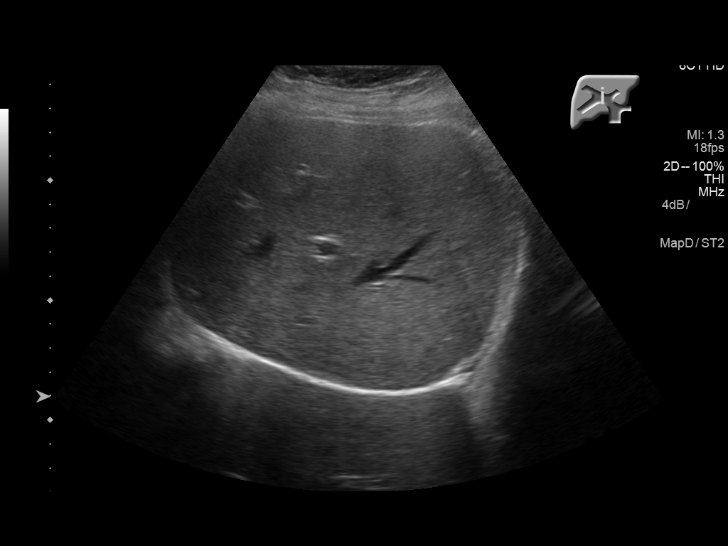
[im 29/76]
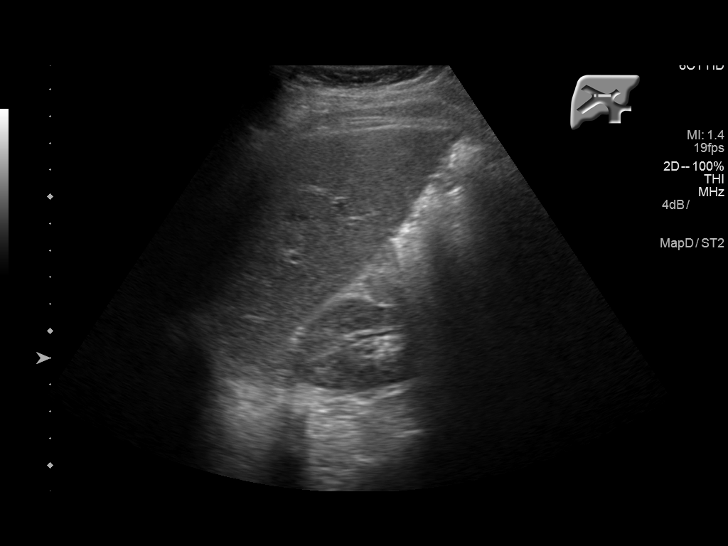
[im 35/76]
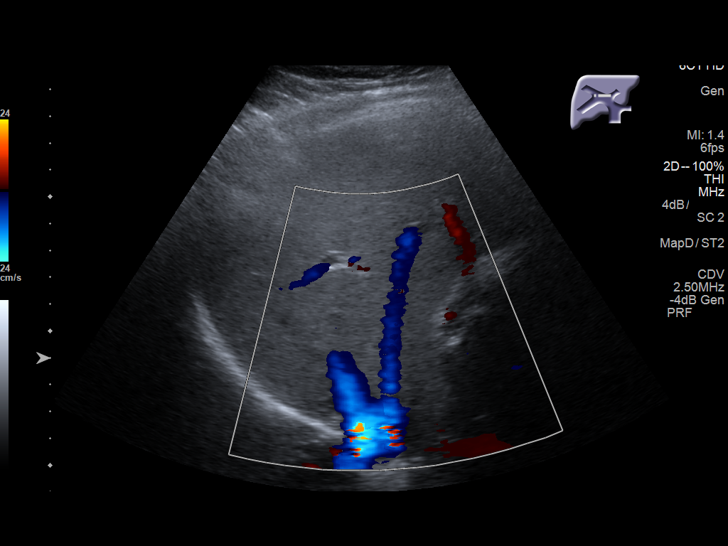
[im 41/76]
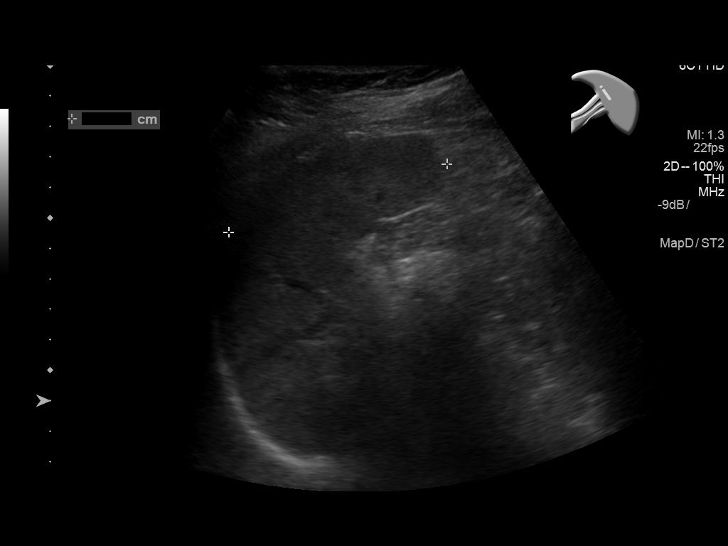
[im 47/76]
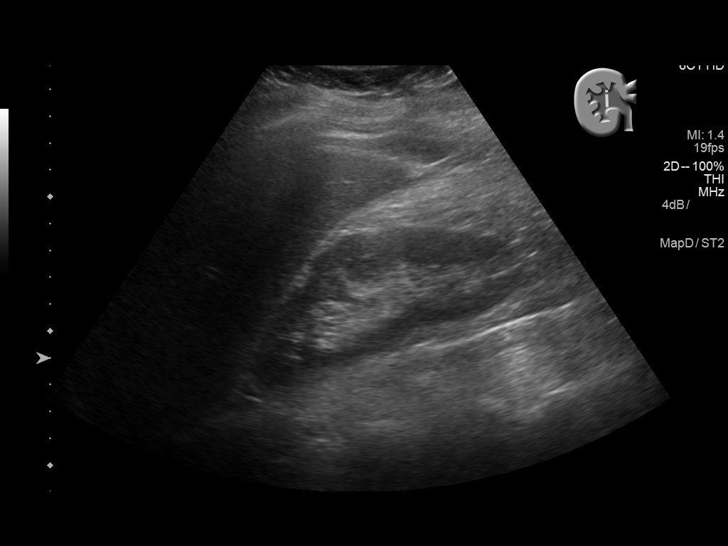
[im 51/76]
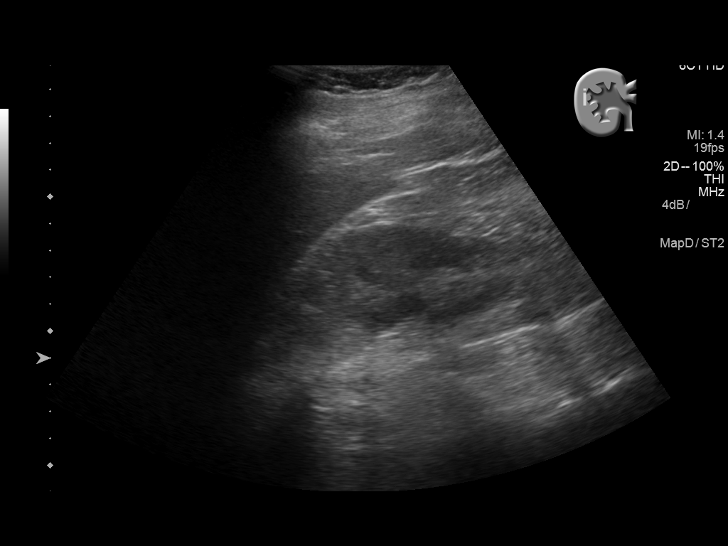
[im 57/76]
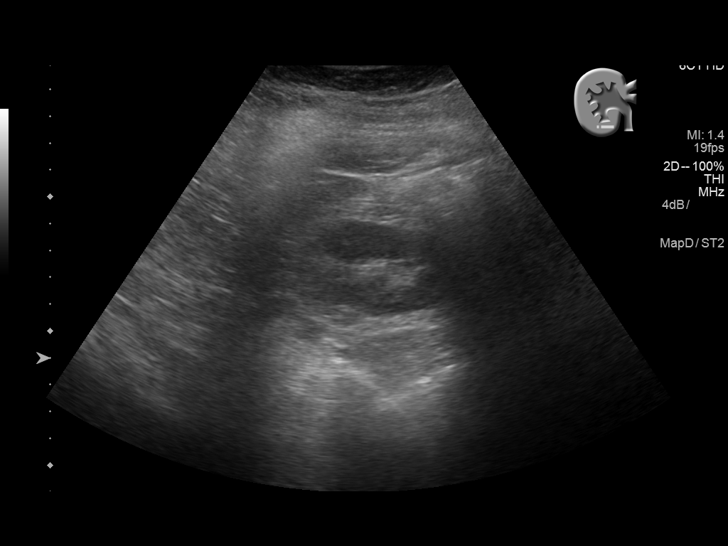
[im 63/76]
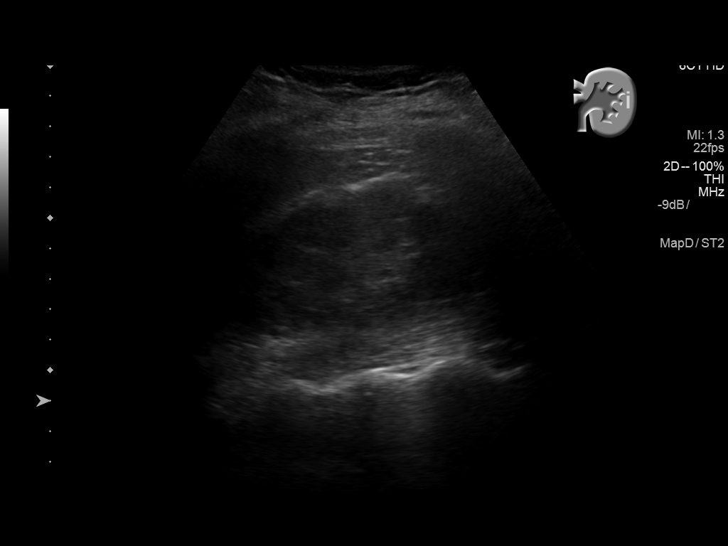
[im 69/76]
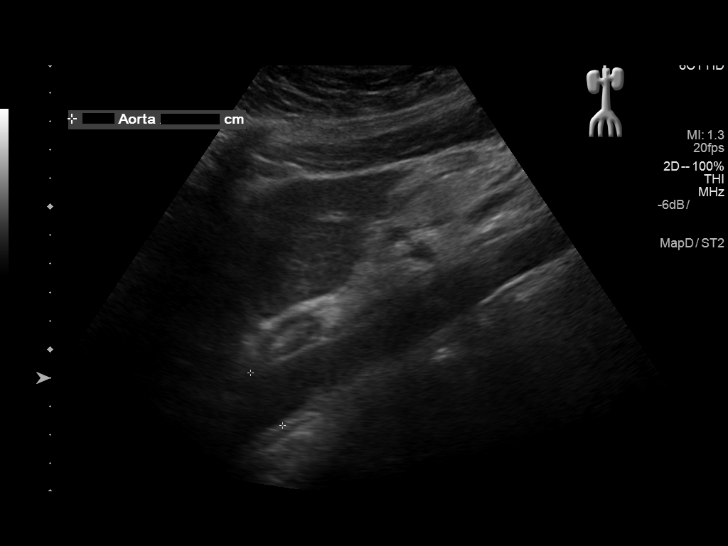
[im 76/76]
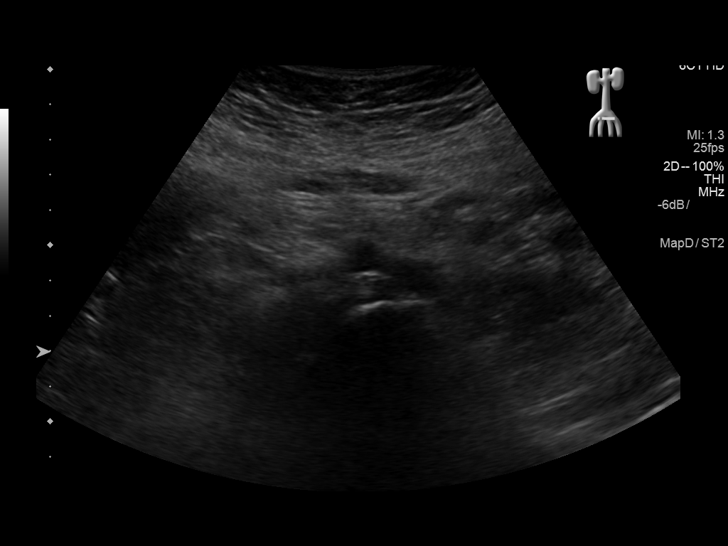

[14 of 25 positions shown; findings below may reference images not displayed]

FINDINGS: Gallbladder: No gallstones or wall thickening visualized. No
sonographic Murphy sign noted by sonographer.

Common bile duct: Diameter: 3 mm

Liver: No focal lesion identified. Within normal limits in
parenchymal echogenicity.

IVC: No abnormality visualized.

Pancreas: Visualized portion unremarkable.

Spleen: Size and appearance within normal limits.

Right Kidney: Length: 11.4 cm. Echogenicity within normal limits. No
mass or hydronephrosis visualized.

Left Kidney: Length: 11.6 cm. Echogenicity within normal limits. No
mass or hydronephrosis visualized.

Abdominal aorta: No aneurysm visualized.

Other findings: None.
IMPRESSION: Normal abdominal ultrasound.

## 2018-10-19 LAB — OB RESULTS CONSOLE RPR: RPR: NONREACTIVE

## 2018-10-19 LAB — OB RESULTS CONSOLE ABO/RH: RH Type: POSITIVE

## 2018-10-19 LAB — OB RESULTS CONSOLE GC/CHLAMYDIA
Chlamydia: NEGATIVE
Gonorrhea: NEGATIVE

## 2018-10-19 LAB — OB RESULTS CONSOLE ANTIBODY SCREEN: Antibody Screen: NEGATIVE

## 2018-10-19 LAB — OB RESULTS CONSOLE HIV ANTIBODY (ROUTINE TESTING): HIV: NONREACTIVE

## 2018-10-19 LAB — OB RESULTS CONSOLE RUBELLA ANTIBODY, IGM: Rubella: IMMUNE

## 2018-10-19 LAB — OB RESULTS CONSOLE HEPATITIS B SURFACE ANTIGEN: Hepatitis B Surface Ag: NEGATIVE

## 2019-01-21 NOTE — L&D Delivery Note (Signed)
Delivery Note  SVD viable female Apgars 9,9 over 2nd degree ML Lac.  Placenta retained and was delivered manually.  Banjo currettage of the endometrial lining followed by MEU with no obvious retained fragments.  3VC noted Repair with 2-0 Chromic with good support and hemostasis noted.  R/V exam confirms.  PH art was sent.   Mother and baby to couplet care and are doing well.  EBL 350cc  Candice Camp, MD

## 2019-04-25 LAB — OB RESULTS CONSOLE GBS: GBS: NEGATIVE

## 2019-05-07 ENCOUNTER — Other Ambulatory Visit: Payer: Self-pay

## 2019-05-07 ENCOUNTER — Encounter (HOSPITAL_COMMUNITY): Payer: Self-pay

## 2019-05-07 ENCOUNTER — Inpatient Hospital Stay (HOSPITAL_COMMUNITY)
Admission: AD | Admit: 2019-05-07 | Discharge: 2019-05-07 | Disposition: A | Discharge status: Home / Self Care | Payer: BC Managed Care – PPO | Attending: Obstetrics and Gynecology | Admitting: Obstetrics and Gynecology

## 2019-05-07 DIAGNOSIS — O479 False labor, unspecified: Secondary | ICD-10-CM | POA: Diagnosis not present

## 2019-05-07 DIAGNOSIS — Z3A37 37 weeks gestation of pregnancy: Secondary | ICD-10-CM | POA: Insufficient documentation

## 2019-05-07 HISTORY — DX: False labor, unspecified: O47.9

## 2019-05-07 NOTE — MAU Note (Signed)
April Frederick is a 34 y.o. at [redacted]w[redacted]d here in MAU reporting: contractions since this afternoon, since about 1500. They are "coming". No bleeding or LOF.  Onset of complaint: today  Pain score: 6/10  Vitals:   05/07/19 1745  BP: (!) 114/57  Pulse: (!) 106  Resp: 16  Temp: 98.5 F (36.9 C)  SpO2: 97%     FHT: +FM  Lab orders placed from triage: none

## 2019-05-07 NOTE — Discharge Instructions (Signed)
Please return to MAU when your contractions are 3-5 mins apart and much stronger than they are now OR if you have vaginal bleeding like a period OR the baby stops moving as usual.

## 2019-05-07 NOTE — MAU Provider Note (Signed)
Ms. Zyaire Dumas is a [id number] at [redacted]w[redacted]d seen in MAU for labor. RN labor check, not seen by provider. SVE by RN Dilation: 1.5 Effacement (%): Thick Cervical Position: Posterior Station: -3 Presentation: Vertex Exam by:: AMariel Sleet, RN  *Cervical exam is the same as her last cervical exam on 05/04/2019  NST - FHR: 150 bpm / moderate variability / accels present / decels absent / TOCO: irregular UC's  Plan:  D/C home with labor precautions Keep scheduled appt with [facility] on 05/10/2019  Raelyn Mora, [facility]  05/07/2019 6:41 PM

## 2019-05-11 ENCOUNTER — Encounter (HOSPITAL_COMMUNITY): Payer: Self-pay | Admitting: *Deleted

## 2019-05-11 ENCOUNTER — Telehealth (HOSPITAL_COMMUNITY): Payer: Self-pay | Admitting: *Deleted

## 2019-05-11 NOTE — Telephone Encounter (Signed)
Preadmission screen  

## 2019-05-13 ENCOUNTER — Telehealth (HOSPITAL_COMMUNITY): Payer: Self-pay | Admitting: *Deleted

## 2019-05-13 NOTE — Telephone Encounter (Signed)
Preadmission screen  

## 2019-05-16 ENCOUNTER — Telehealth (HOSPITAL_COMMUNITY): Payer: Self-pay | Admitting: *Deleted

## 2019-05-16 NOTE — Telephone Encounter (Signed)
Preadmission screen  

## 2019-05-17 ENCOUNTER — Encounter (HOSPITAL_COMMUNITY): Payer: Self-pay | Admitting: *Deleted

## 2019-05-17 ENCOUNTER — Telehealth (HOSPITAL_COMMUNITY): Payer: Self-pay | Admitting: *Deleted

## 2019-05-17 NOTE — Telephone Encounter (Signed)
Preadmission screen  

## 2019-05-21 ENCOUNTER — Other Ambulatory Visit (HOSPITAL_COMMUNITY)
Admission: RE | Admit: 2019-05-21 | Discharge: 2019-05-21 | Disposition: A | Discharge status: Home / Self Care | Payer: BC Managed Care – PPO | Source: Ambulatory Visit | Attending: Obstetrics and Gynecology | Admitting: Obstetrics and Gynecology

## 2019-05-21 DIAGNOSIS — Z01812 Encounter for preprocedural laboratory examination: Secondary | ICD-10-CM | POA: Insufficient documentation

## 2019-05-21 DIAGNOSIS — Z20822 Contact with and (suspected) exposure to covid-19: Secondary | ICD-10-CM | POA: Insufficient documentation

## 2019-05-21 LAB — SARS CORONAVIRUS 2 (TAT 6-24 HRS): SARS Coronavirus 2: NEGATIVE

## 2019-05-23 ENCOUNTER — Inpatient Hospital Stay (HOSPITAL_COMMUNITY): Payer: BC Managed Care – PPO | Admitting: Anesthesiology

## 2019-05-23 ENCOUNTER — Inpatient Hospital Stay (HOSPITAL_COMMUNITY)
Admission: AD | Admit: 2019-05-23 | Discharge: 2019-05-25 | DRG: 798 | Disposition: A | Discharge status: Home / Self Care | Payer: BC Managed Care – PPO | Attending: Obstetrics and Gynecology | Admitting: Obstetrics and Gynecology

## 2019-05-23 ENCOUNTER — Other Ambulatory Visit: Payer: Self-pay

## 2019-05-23 ENCOUNTER — Encounter (HOSPITAL_COMMUNITY): Payer: Self-pay | Admitting: Obstetrics and Gynecology

## 2019-05-23 ENCOUNTER — Inpatient Hospital Stay (HOSPITAL_COMMUNITY): Payer: BC Managed Care – PPO

## 2019-05-23 DIAGNOSIS — Z3A39 39 weeks gestation of pregnancy: Secondary | ICD-10-CM

## 2019-05-23 DIAGNOSIS — Z3490 Encounter for supervision of normal pregnancy, unspecified, unspecified trimester: Secondary | ICD-10-CM

## 2019-05-23 DIAGNOSIS — Z20822 Contact with and (suspected) exposure to covid-19: Secondary | ICD-10-CM | POA: Diagnosis present

## 2019-05-23 DIAGNOSIS — O99214 Obesity complicating childbirth: Secondary | ICD-10-CM | POA: Diagnosis present

## 2019-05-23 DIAGNOSIS — Z87891 Personal history of nicotine dependence: Secondary | ICD-10-CM | POA: Diagnosis not present

## 2019-05-23 DIAGNOSIS — O26893 Other specified pregnancy related conditions, third trimester: Secondary | ICD-10-CM | POA: Diagnosis present

## 2019-05-23 DIAGNOSIS — O479 False labor, unspecified: Principal | ICD-10-CM

## 2019-05-23 HISTORY — DX: Encounter for supervision of normal pregnancy, unspecified, unspecified trimester: Z34.90

## 2019-05-23 LAB — CBC
HCT: 40.9 % (ref 36.0–46.0)
Hemoglobin: 13.8 g/dL (ref 12.0–15.0)
MCH: 31.6 pg (ref 26.0–34.0)
MCHC: 33.7 g/dL (ref 30.0–36.0)
MCV: 93.6 fL (ref 80.0–100.0)
Platelets: 224 10*3/uL (ref 150–400)
RBC: 4.37 MIL/uL (ref 3.87–5.11)
RDW: 13.1 % (ref 11.5–15.5)
WBC: 6.8 10*3/uL (ref 4.0–10.5)
nRBC: 0 % (ref 0.0–0.2)

## 2019-05-23 LAB — TYPE AND SCREEN
ABO/RH(D): A POS
Antibody Screen: NEGATIVE

## 2019-05-23 LAB — RPR: RPR Ser Ql: NONREACTIVE

## 2019-05-23 LAB — ABO/RH: ABO/RH(D): A POS

## 2019-05-23 MED ORDER — OXYCODONE-ACETAMINOPHEN 5-325 MG PO TABS: 2.0000 | ORAL_TABLET | ORAL | Status: DC | PRN

## 2019-05-23 MED ORDER — SENNOSIDES-DOCUSATE SODIUM 8.6-50 MG PO TABS
2.0000 | ORAL_TABLET | ORAL | Status: DC
  Administered 2019-05-23 – 2019-05-25 (×2): 2 via ORAL
  Filled 2019-05-23 (×2): qty 2

## 2019-05-23 MED ORDER — PHENYLEPHRINE 40 MCG/ML (10ML) SYRINGE FOR IV PUSH (FOR BLOOD PRESSURE SUPPORT): 80.0000 ug | PREFILLED_SYRINGE | INTRAVENOUS | Status: DC | PRN

## 2019-05-23 MED ORDER — CEFAZOLIN SODIUM-DEXTROSE 2-4 GM/100ML-% IV SOLN
2.0000 g | Freq: Three times a day (TID) | INTRAVENOUS | Status: DC
  Administered 2019-05-23: 2 g via INTRAVENOUS
  Filled 2019-05-23 (×2): qty 100

## 2019-05-23 MED ORDER — ONDANSETRON HCL 4 MG/2ML IJ SOLN: 4.0000 mg | Freq: Four times a day (QID) | INTRAMUSCULAR | Status: DC | PRN

## 2019-05-23 MED ORDER — DIPHENHYDRAMINE HCL 25 MG PO CAPS: 25.0000 mg | ORAL_CAPSULE | Freq: Four times a day (QID) | ORAL | Status: DC | PRN

## 2019-05-23 MED ORDER — IBUPROFEN 600 MG PO TABS
600.0000 mg | ORAL_TABLET | Freq: Four times a day (QID) | ORAL | Status: DC
  Administered 2019-05-23 – 2019-05-25 (×7): 600 mg via ORAL
  Filled 2019-05-23 (×7): qty 1

## 2019-05-23 MED ORDER — BENZOCAINE-MENTHOL 20-0.5 % EX AERO
1.0000 "application " | INHALATION_SPRAY | CUTANEOUS | Status: DC | PRN
  Administered 2019-05-24: 1 via TOPICAL
  Filled 2019-05-23 (×2): qty 56

## 2019-05-23 MED ORDER — FENTANYL-BUPIVACAINE-NACL 0.5-0.125-0.9 MG/250ML-% EP SOLN
12.0000 mL/h | EPIDURAL | Status: DC | PRN
  Filled 2019-05-23: qty 250

## 2019-05-23 MED ORDER — ONDANSETRON HCL 4 MG/2ML IJ SOLN: 4.0000 mg | INTRAMUSCULAR | Status: DC | PRN

## 2019-05-23 MED ORDER — LIDOCAINE HCL (PF) 1 % IJ SOLN
INTRAMUSCULAR | Status: DC | PRN
  Administered 2019-05-23 (×2): 4 mL via EPIDURAL

## 2019-05-23 MED ORDER — ACETAMINOPHEN 325 MG PO TABS: 650.0000 mg | ORAL_TABLET | ORAL | Status: DC | PRN

## 2019-05-23 MED ORDER — MEASLES, MUMPS & RUBELLA VAC IJ SOLR: 0.5000 mL | Freq: Once | INTRAMUSCULAR | Status: DC

## 2019-05-23 MED ORDER — OXYTOCIN 40 UNITS IN NORMAL SALINE INFUSION - SIMPLE MED: 2.5000 [IU]/h | INTRAVENOUS | Status: DC

## 2019-05-23 MED ORDER — MEDROXYPROGESTERONE ACETATE 150 MG/ML IM SUSP: 150.0000 mg | INTRAMUSCULAR | Status: DC | PRN

## 2019-05-23 MED ORDER — LACTATED RINGERS IV SOLN: 500.0000 mL | Freq: Once | INTRAVENOUS | Status: DC

## 2019-05-23 MED ORDER — OXYCODONE-ACETAMINOPHEN 5-325 MG PO TABS: 1.0000 | ORAL_TABLET | ORAL | Status: DC | PRN

## 2019-05-23 MED ORDER — LACTATED RINGERS IV SOLN: INTRAVENOUS | Status: DC

## 2019-05-23 MED ORDER — EPHEDRINE 5 MG/ML INJ: 10.0000 mg | INTRAVENOUS | Status: DC | PRN

## 2019-05-23 MED ORDER — SIMETHICONE 80 MG PO CHEW: 80.0000 mg | CHEWABLE_TABLET | ORAL | Status: DC | PRN

## 2019-05-23 MED ORDER — ONDANSETRON HCL 4 MG PO TABS: 4.0000 mg | ORAL_TABLET | ORAL | Status: DC | PRN

## 2019-05-23 MED ORDER — ACETAMINOPHEN 325 MG PO TABS
650.0000 mg | ORAL_TABLET | ORAL | Status: DC | PRN
  Administered 2019-05-24 – 2019-05-25 (×7): 650 mg via ORAL
  Filled 2019-05-23 (×7): qty 2

## 2019-05-23 MED ORDER — SODIUM CHLORIDE (PF) 0.9 % IJ SOLN
INTRAMUSCULAR | Status: DC | PRN
  Administered 2019-05-23: 12 mL/h via EPIDURAL

## 2019-05-23 MED ORDER — LACTATED RINGERS IV SOLN: 500.0000 mL | INTRAVENOUS | Status: DC | PRN

## 2019-05-23 MED ORDER — WITCH HAZEL-GLYCERIN EX PADS: 1.0000 "application " | MEDICATED_PAD | CUTANEOUS | Status: DC | PRN

## 2019-05-23 MED ORDER — OXYTOCIN BOLUS FROM INFUSION
500.0000 mL | Freq: Once | INTRAVENOUS | Status: AC
  Administered 2019-05-23: 500 mL via INTRAVENOUS

## 2019-05-23 MED ORDER — TERBUTALINE SULFATE 1 MG/ML IJ SOLN: 0.2500 mg | Freq: Once | INTRAMUSCULAR | Status: DC | PRN

## 2019-05-23 MED ORDER — COCONUT OIL OIL: 1.0000 "application " | TOPICAL_OIL | Status: DC | PRN

## 2019-05-23 MED ORDER — PRENATAL MULTIVITAMIN CH
1.0000 | ORAL_TABLET | Freq: Every day | ORAL | Status: DC
  Administered 2019-05-24: 1 via ORAL
  Filled 2019-05-23: qty 1

## 2019-05-23 MED ORDER — DIBUCAINE (PERIANAL) 1 % EX OINT: 1.0000 "application " | TOPICAL_OINTMENT | CUTANEOUS | Status: DC | PRN

## 2019-05-23 MED ORDER — TETANUS-DIPHTH-ACELL PERTUSSIS 5-2.5-18.5 LF-MCG/0.5 IM SUSP: 0.5000 mL | Freq: Once | INTRAMUSCULAR | Status: DC

## 2019-05-23 MED ORDER — ZOLPIDEM TARTRATE 5 MG PO TABS: 5.0000 mg | ORAL_TABLET | Freq: Every evening | ORAL | Status: DC | PRN

## 2019-05-23 MED ORDER — DIPHENHYDRAMINE HCL 50 MG/ML IJ SOLN: 12.5000 mg | INTRAMUSCULAR | Status: DC | PRN

## 2019-05-23 MED ORDER — BUTORPHANOL TARTRATE 1 MG/ML IJ SOLN: 1.0000 mg | INTRAMUSCULAR | Status: DC | PRN

## 2019-05-23 MED ORDER — OXYTOCIN 40 UNITS IN NORMAL SALINE INFUSION - SIMPLE MED
1.0000 m[IU]/min | INTRAVENOUS | Status: DC
  Administered 2019-05-23: 8 m[IU]/min via INTRAVENOUS
  Administered 2019-05-23: 2 m[IU]/min via INTRAVENOUS
  Filled 2019-05-23: qty 1000

## 2019-05-23 MED ORDER — LIDOCAINE HCL (PF) 1 % IJ SOLN: 30.0000 mL | INTRAMUSCULAR | Status: DC | PRN

## 2019-05-23 MED ORDER — SOD CITRATE-CITRIC ACID 500-334 MG/5ML PO SOLN: 30.0000 mL | ORAL | Status: DC | PRN

## 2019-05-23 NOTE — H&P (Signed)
April Frederick is a 34 y.o. female presenting for IOL for elective reasons.  Pregnancy uncomplicated  GBS-. OB History    Gravida  4   Para  1   Term  1   Preterm      AB  2   Living  1     SAB      TAB  1   Ectopic  1   Multiple  0   Live Births  1          Past Medical History:  Diagnosis Date  . Ectopic pregnancy    Had MTX X 2  . Herpes   . Hypothyroidism    Past Surgical History:  Procedure Laterality Date  . NO PAST SURGERIES    . OTHER SURGICAL HISTORY     states had surgery  at [facility] uncertain of year does not remember what  it was about.   Family History: family history includes Cancer in her paternal aunt and paternal grandfather; Diabetes in her mother and paternal grandfather. Social History:  reports that she has quit smoking. She has never used smokeless tobacco. She reports that she does not drink alcohol or use drugs.     Maternal Diabetes: No Genetic Screening: Normal Maternal Ultrasounds/Referrals: Normal Fetal Ultrasounds or other Referrals:  None Maternal Substance Abuse:  No Significant Maternal Medications:  None Significant Maternal Lab Results:  Group B Strep negative Other Comments:  None  Review of Systems History Dilation: 2 Effacement (%): 80 Station: -2 Exam by:: Dr. Rana Snare Blood pressure 126/82, pulse 79, temperature 98.2 F (36.8 C), temperature source Oral, resp. rate 18, height 5\' 5"  (1.651 m), weight 109.8 kg, unknown if currently breastfeeding. Exam Physical Exam  Prenatal labs: ABO, Rh: --/--/PENDING (05/03 01-01-2006) Antibody: PENDING (05/03 0919) Rubella: Immune (09/29 0000) RPR: Nonreactive (09/29 0000)  HBsAg: Negative (09/29 0000)  HIV: Non-reactive (09/29 0000)  GBS: Negative/-- (04/05 0000)   Assessment/Plan: IUP at term IOL AROM and pitocin.  Anticipate SVD   06-10-1995 05/23/2019, 10:13 AM

## 2019-05-23 NOTE — Anesthesia Procedure Notes (Signed)
Epidural Patient location during procedure: OB Start time: 05/23/2019 2:52 PM End time: 05/23/2019 2:55 PM  Staffing Anesthesiologist: Kaylyn Layer, MD Performed: anesthesiologist   Preanesthetic Checklist Completed: patient identified, IV checked, risks and benefits discussed, monitors and equipment checked, pre-op evaluation and timeout performed  Epidural Patient position: sitting Prep: DuraPrep and site prepped and draped Patient monitoring: continuous pulse ox, blood pressure and heart rate Approach: midline Location: L3-L4 Injection technique: LOR air  Needle:  Needle type: Tuohy  Needle gauge: 17 G Needle length: 9 cm Needle insertion depth: 8 cm Catheter type: closed end flexible Catheter size: 19 Gauge Catheter at skin depth: 13 cm Test dose: negative and Other (1% lidocaine)  Assessment Events: blood not aspirated, injection not painful, no injection resistance, no paresthesia and negative IV test  Additional Notes Patient identified. Risks, benefits, and alternatives discussed with patient including but not limited to bleeding, infection, nerve damage, paralysis, failed block, incomplete pain control, headache, blood pressure changes, nausea, vomiting, reactions to medication, itching, and postpartum back pain. Confirmed with bedside nurse the patient's most recent platelet count. Confirmed with patient that they are not currently taking any anticoagulation, have any bleeding history, or any family history of bleeding disorders. Patient expressed understanding and wished to proceed. All questions were answered. Sterile technique was used throughout the entire procedure. Please see nursing notes for vital signs.   Crisp LOR after one needle redirection. Test dose was given through epidural catheter and negative prior to continuing to dose epidural or start infusion. Warning signs of high block given to the patient including shortness of breath, tingling/numbness in  hands, complete motor block, or any concerning symptoms with instructions to call for help. Patient was given instructions on fall risk and not to get out of bed. All questions and concerns addressed with instructions to call with any issues or inadequate analgesia.  Reason for block:procedure for pain

## 2019-05-23 NOTE — Anesthesia Preprocedure Evaluation (Signed)
Anesthesia Evaluation  Patient identified by MRN, date of birth, ID band Patient awake    Reviewed: Allergy & Precautions, Patient's Chart, lab work & pertinent test results  History of Anesthesia Complications Negative for: history of anesthetic complications  Airway Mallampati: II  TM Distance: >3 FB Neck ROM: Full    Dental no notable dental hx.    Pulmonary former smoker,    Pulmonary exam normal        Cardiovascular negative cardio ROS Normal cardiovascular exam     Neuro/Psych negative neurological ROS  negative psych ROS   GI/Hepatic negative GI ROS, Neg liver ROS,   Endo/Other  Hypothyroidism Morbid obesity  Renal/GU negative Renal ROS  negative genitourinary   Musculoskeletal negative musculoskeletal ROS (+)   Abdominal   Peds  Hematology negative hematology ROS (+)   Anesthesia Other Findings Day of surgery medications reviewed with patient.  Reproductive/Obstetrics (+) Pregnancy                             Anesthesia Physical Anesthesia Plan  ASA: III  Anesthesia Plan: Epidural   Post-op Pain Management:    Induction:   PONV Risk Score and Plan: Treatment may vary due to age or medical condition  Airway Management Planned: Natural Airway  Additional Equipment:   Intra-op Plan:   Post-operative Plan:   Informed Consent: I have reviewed the patients History and Physical, chart, labs and discussed the procedure including the risks, benefits and alternatives for the proposed anesthesia with the patient or authorized representative who has indicated his/her understanding and acceptance.       Plan Discussed with:   Anesthesia Plan Comments:         Anesthesia Quick Evaluation

## 2019-05-24 ENCOUNTER — Encounter (HOSPITAL_COMMUNITY): Payer: Self-pay | Admitting: Obstetrics and Gynecology

## 2019-05-24 LAB — CBC
HCT: 37.8 % (ref 36.0–46.0)
Hemoglobin: 12.6 g/dL (ref 12.0–15.0)
MCH: 31.4 pg (ref 26.0–34.0)
MCHC: 33.3 g/dL (ref 30.0–36.0)
MCV: 94.3 fL (ref 80.0–100.0)
Platelets: 209 10*3/uL (ref 150–400)
RBC: 4.01 MIL/uL (ref 3.87–5.11)
RDW: 13.2 % (ref 11.5–15.5)
WBC: 11.5 10*3/uL — ABNORMAL HIGH (ref 4.0–10.5)
nRBC: 0 % (ref 0.0–0.2)

## 2019-05-24 NOTE — Progress Notes (Signed)
Post Partum Day 1 Subjective: no complaints, up ad lib, voiding, tolerating PO and + flatus  Objective: Blood pressure 111/65, pulse 63, temperature 97.6 F (36.4 C), temperature source Oral, resp. rate 18, height 5\' 5"  (1.651 m), weight 109.8 kg, SpO2 100 %, unknown if currently breastfeeding.  Physical Exam:  General: no distress Lochia: appropriate Uterine Fundus: firm Incision: healing well DVT Evaluation: No evidence of DVT seen on physical exam.  Recent Labs    05/23/19 0919 05/24/19 0628  HGB 13.8 12.6  HCT 40.9 37.8    Assessment/Plan: Plan for discharge tomorrow No circ  LOS: 1 day   07/24/19 II 05/24/2019, 7:24 AM

## 2019-05-24 NOTE — Anesthesia Postprocedure Evaluation (Signed)
Anesthesia Post Note  Patient: April Frederick  Procedure(s) Performed: AN AD HOC LABOR EPIDURAL     Patient location during evaluation: Mother Baby Anesthesia Type: Epidural Level of consciousness: awake Pain management: satisfactory to patient Vital Signs Assessment: post-procedure vital signs reviewed and stable Respiratory status: spontaneous breathing Cardiovascular status: stable Anesthetic complications: no    Last Vitals:  Vitals:   05/24/19 0113 05/24/19 0548  BP: 123/65 111/65  Pulse: 72 63  Resp: 18 18  Temp: 36.7 C 36.4 C  SpO2:      Last Pain:  Vitals:   05/24/19 0548  TempSrc: Oral  PainSc: 3    Pain Goal:                   Cephus Shelling

## 2019-05-24 NOTE — Lactation Note (Signed)
This note was copied from a baby's chart. Lactation Consultation Note  Patient Name: Boy Isaura Schiller [id number] Date: 05/24/2019 Reason for consult: Initial assessment;Term P2.  Breastfeeding did not go well with first baby.  She was a poor feeder and followed by [employer] feeding team.  Newborn is 4 hours old.  Mom reports that baby is latching well and feeding for 15-40 minutes.  Discussed importance of obtaining a deep latch.  Instructed to feed with cues at least 8 times in 24 hours.  Answered questions.  Encouraged to call for assist prn.  Breastfeeding consultation services information given and reviewed.  Maternal Data    Feeding Feeding Type: Breast Fed  LATCH Score                   Interventions    Lactation Tools Discussed/Used     Consult Status Consult Status: Follow-up Date: 05/25/19 Follow-up type: In-patient    Huston Foley 05/24/2019, 11:15 AM

## 2019-05-24 NOTE — Plan of Care (Signed)
  Problem: Education: Goal: Knowledge of General Education information will improve Description: Including pain rating scale, medication(s)/side effects and non-pharmacologic comfort measures Outcome: Completed/Met   Problem: Clinical Measurements: Goal: Ability to maintain clinical measurements within normal limits will improve Outcome: Completed/Met Goal: Will remain free from infection Outcome: Completed/Met Goal: Diagnostic test results will improve Outcome: Completed/Met   Problem: Activity: Goal: Risk for activity intolerance will decrease Outcome: Completed/Met   Problem: Elimination: Goal: Will not experience complications related to bowel motility Outcome: Completed/Met   Problem: Safety: Goal: Ability to remain free from injury will improve Outcome: Completed/Met   Problem: Education: Goal: Knowledge of condition will improve Outcome: Completed/Met   Problem: Activity: Goal: Will verbalize the importance of balancing activity with adequate rest periods Outcome: Completed/Met Goal: Ability to tolerate increased activity will improve Outcome: Completed/Met   Problem: Life Cycle: Goal: Chance of risk for complications during the postpartum period will decrease Outcome: Completed/Met   Problem: Role Relationship: Goal: Ability to demonstrate positive interaction with newborn will improve Outcome: Completed/Met   Problem: Education: Goal: Knowledge of condition will improve Outcome: Completed/Met   Problem: Activity: Goal: Will verbalize the importance of balancing activity with adequate rest periods Outcome: Completed/Met

## 2019-05-25 MED ORDER — IBUPROFEN 800 MG PO TABS: 800.0000 mg | ORAL_TABLET | Freq: Three times a day (TID) | ORAL | 0 refills | Status: DC | PRN

## 2019-05-25 NOTE — Progress Notes (Signed)
Post Partum Day 2 Subjective: no complaints, up ad lib, voiding and tolerating PO.  Declines circ.  Objective: Blood pressure 116/68, pulse 65, temperature 97.8 F (36.6 C), temperature source Oral, resp. rate 18, height 5\' 5"  (1.651 m), weight 109.8 kg, SpO2 100 %, unknown if currently breastfeeding.  Physical Exam:  General: alert, cooperative and appears stated age Lochia: appropriate Uterine Fundus: firm Incision: healing well, no significant drainage, no dehiscence DVT Evaluation: No evidence of DVT seen on physical exam. Negative Homan's sign. No cords or calf tenderness.  Recent Labs    05/23/19 0919 05/24/19 0628  HGB 13.8 12.6  HCT 40.9 37.8    Assessment/Plan: Discharge home and Breastfeeding   LOS: 2 days   07/24/19 05/25/2019, 8:32 AM

## 2019-05-25 NOTE — Discharge Summary (Signed)
Obstetric Discharge Summary Reason for Admission: induction of labor Prenatal Procedures: none Intrapartum Procedures: spontaneous vaginal delivery and curettage Postpartum Procedures: none Complications-Operative and Postpartum: 2nd degree perineal laceration and retained placenta requiring bedside Banjo curettage Hemoglobin  Date Value Ref Range Status  05/24/2019 12.6 12.0 - 15.0 g/dL Final   HCT  Date Value Ref Range Status  05/24/2019 37.8 36.0 - 46.0 % Final    Physical Exam:  General: alert, cooperative and appears stated age Lochia: appropriate Uterine Fundus: firm Incision: healing well, no significant drainage, no dehiscence DVT Evaluation: No evidence of DVT seen on physical exam. Negative Homan's sign. No cords or calf tenderness.  Discharge Diagnoses: Term Pregnancy-delivered  Discharge Information: Date: 05/25/2019 Activity: pelvic rest Diet: routine Medications: PNV and Ibuprofen Condition: stable Instructions: refer to practice specific booklet Discharge to: home   Newborn Data: Live born female  Birth Weight: 8 lb 1.6 oz (3674 g) APGAR: 8, 9  Newborn Delivery   Birth date/time: 05/23/2019 18:08:00 Delivery type: Vaginal, Spontaneous      Home with mother.  April Frederick 05/25/2019, 8:34 AM

## 2019-05-25 NOTE — Discharge Instructions (Signed)
Call MD for T>100.4, heavy vaginal bleeding, severe abdominal pain or respiratory distress.  Call office to schedule postpartum visit.  Pelvic rest x 6 weeks.

## 2019-05-25 NOTE — Addendum Note (Signed)
Addendum  created 05/25/19 0905 by Cecile Hearing, MD   [facility] Staff edited

## 2021-12-16 ENCOUNTER — Ambulatory Visit (INDEPENDENT_AMBULATORY_CARE_PROVIDER_SITE_OTHER): Payer: BC Managed Care – PPO | Admitting: Podiatry

## 2021-12-16 ENCOUNTER — Encounter: Payer: Self-pay | Admitting: Podiatry

## 2021-12-16 DIAGNOSIS — O09529 Supervision of elderly multigravida, unspecified trimester: Secondary | ICD-10-CM

## 2021-12-16 DIAGNOSIS — B009 Herpesviral infection, unspecified: Secondary | ICD-10-CM | POA: Insufficient documentation

## 2021-12-16 DIAGNOSIS — S90221A Contusion of right lesser toe(s) with damage to nail, initial encounter: Secondary | ICD-10-CM

## 2021-12-16 HISTORY — DX: Supervision of elderly multigravida, unspecified trimester: O09.529

## 2021-12-16 NOTE — Progress Notes (Signed)
  Subjective:  Patient ID: April Frederick, female    DOB: 02/19/1985,  MRN: [id number]  Chief Complaint  Patient presents with   Nail Problem    np right great toenail has spot on it and is achy    36 y.o. female presents with the above complaint. History confirmed with patient.  This started a couple months ago, her son dropped a phone on it  Objective:  Physical Exam: warm, good capillary refill, no trophic changes or ulcerative lesions, normal DP and PT pulses, normal sensory exam, and mild subungual hematoma growing out from nail plate, well organized dark brown, involves less than 10% of nail plate.  No pain or signs of infection.  No evidence of onychomycosis  Assessment:   1. Subungual hematoma of foot, right, initial encounter      Plan:  Patient was evaluated and treated and all questions answered.  We discussed the nature of contusions of the toe and subungual hematoma.  Advised this likely will take some time to grow out.  Currently there is no signs infections or nail dystrophy or onychomycosis.  I think she can can safely resume nail polish but advised her to check it monthly.  Discussed it may take up to 6 months for this to completely resolve  Return if symptoms worsen or fail to improve.

## 2022-01-18 ENCOUNTER — Ambulatory Visit
Admission: EM | Admit: 2022-01-18 | Discharge: 2022-01-18 | Disposition: A | Discharge status: Home / Self Care | Payer: BC Managed Care – PPO | Attending: Emergency Medicine | Admitting: Emergency Medicine

## 2022-01-18 DIAGNOSIS — J029 Acute pharyngitis, unspecified: Secondary | ICD-10-CM

## 2022-01-18 DIAGNOSIS — J040 Acute laryngitis: Secondary | ICD-10-CM | POA: Diagnosis not present

## 2022-01-18 LAB — POCT RAPID STREP A (OFFICE): Rapid Strep A Screen: NEGATIVE

## 2022-01-18 MED ORDER — PREDNISONE 10 MG PO TABS: 40.0000 mg | ORAL_TABLET | Freq: Every day | ORAL | 0 refills | Status: AC

## 2022-01-18 NOTE — ED Triage Notes (Signed)
Patient to Urgent Care with complaints of sore throat/ hoarseness. Painful swallowing. No fevers.   Symptoms started yesterday. Negative covid test yesterday. Taking ibuprofen.

## 2022-01-18 NOTE — Discharge Instructions (Addendum)
The strep test is negative.    Take the prednisone as directed.  Take Tylenol as needed for fever or discomfort.  Rest your voice.    Follow-up with your primary care provider if your symptoms are not improving.

## 2022-01-18 NOTE — ED Provider Notes (Signed)
April Frederick    CSN: [id number] Arrival date & time: 01/18/22  0998      History   Chief Complaint Chief Complaint  Patient presents with   Sore Throat    HPI April Frederick is a 36 y.o. female.  Patient presents with 1 day history of sore throat and hoarseness.  Treatment at home with ibuprofen; last taken at 0345.  She denies fever, rash, cough, shortness of breath, vomiting, diarrhea, or other symptoms.  Negative COVID test at home yesterday.  The history is provided by the patient and medical records.    Past Medical History:  Diagnosis Date   Ectopic pregnancy    Had MTX X 2   Herpes    Hypothyroidism     Patient Active Problem List   Diagnosis Date Noted   Herpes simplex 12/16/2021   Multigravida of advanced maternal age 63/27/2023   Encounter for elective induction of labor 05/23/2019   False labor 05/07/2019   Precordial pain 06/24/2016   NSVD (normal spontaneous vaginal delivery) 05/27/2015   Labor and delivery indication for care or intervention 05/26/2015    Past Surgical History:  Procedure Laterality Date   NO PAST SURGERIES     OTHER SURGICAL HISTORY     states had surgery  at [facility] uncertain of year does not remember what  it was about.    OB History     Gravida  4   Para  2   Term  2   Preterm      AB  2   Living  2      SAB      IAB  1   Ectopic  1   Multiple  0   Live Births  2            Home Medications    Prior to Admission medications   Medication Sig Start Date End Date Taking? Authorizing Provider  predniSONE (DELTASONE) 10 MG tablet Take 4 tablets (40 mg total) by mouth daily for 3 days. 01/18/22 01/21/22 Yes Mickie Bail, NP  acetaminophen (TYLENOL) 325 MG tablet Take 2 tablets (650 mg total) by mouth every 6 (six) hours as needed for mild pain (for pain scale < 4). 05/28/15   Harold Hedge, MD  albuterol (VENTOLIN HFA) 108 (90 Base) MCG/ACT inhaler INHALE 1-2 PUFFS EVERY 4-6 HOURS AS NEEDED  FOR SHORTNESS OF BREATH    [provider]  ibuprofen (ADVIL) 800 MG tablet Take 1 tablet (800 mg total) by mouth every 8 (eight) hours as needed for moderate pain. 05/25/19   Morris, Aundra Millet, DO  levothyroxine (SYNTHROID) 25 MCG tablet Take by mouth.    [provider]  methimazole (TAPAZOLE) 5 MG tablet methimazole 5 mg tablet  TAKE 1 TABLET BY MOUTH EVERY DAY    [provider]  Prenatal Vit-Fe Fumarate-FA (PRENATAL MULTIVITAMIN) TABS tablet Take 1 tablet by mouth daily.    [provider]    Family History Family History  Problem Relation Age of Onset   Diabetes Mother        type 2   Cancer Paternal Aunt    Diabetes Paternal Grandfather    Cancer Paternal Grandfather    Asthma Neg Hx     Social History Social History   Tobacco Use   Smoking status: Former   Smokeless tobacco: Never   Tobacco comments:    quit 2010  Vaping Use   Vaping Use: Never used  Substance Use  Topics   Alcohol use: No   Drug use: No     Allergies   Patient has no known allergies.   Review of Systems Review of Systems  Constitutional:  Negative for chills and fever.  HENT:  Positive for sore throat and voice change. Negative for ear pain.   Respiratory:  Negative for cough and shortness of breath.   Cardiovascular:  Negative for chest pain and palpitations.  Gastrointestinal:  Negative for diarrhea and vomiting.  Skin:  Negative for color change and rash.  All other systems reviewed and are negative.    Physical Exam Triage Vital Signs ED Triage Vitals  Enc Vitals Group     BP 01/18/22 0919 133/82     Pulse Rate 01/18/22 0919 83     Resp 01/18/22 0919 18     Temp 01/18/22 0919 98.7 F (37.1 C)     Temp Source 01/18/22 0919 Oral     SpO2 01/18/22 0919 98 %     Weight --      Height --      Head Circumference --      Peak Flow --      Pain Score 01/18/22 0916 7     Pain Loc --      Pain Edu? --      Excl. in [place]? --    No data  found.  Updated Vital Signs BP 133/82   Pulse 83   Temp 98.7 F (37.1 C) (Oral)   Resp 18   LMP 01/04/2022   SpO2 98%   Visual Acuity Right Eye Distance:   Left Eye Distance:   Bilateral Distance:    Right Eye Near:   Left Eye Near:    Bilateral Near:     Physical Exam Vitals and nursing note reviewed.  Constitutional:      General: She is not in acute distress.    Appearance: She is well-developed. She is not ill-appearing.  HENT:     Right Ear: Tympanic membrane normal.     Left Ear: Tympanic membrane normal.     Nose: Nose normal.     Mouth/Throat:     Mouth: Mucous membranes are moist.     Pharynx: Oropharynx is clear.     Tonsils: 0 on the right. 0 on the left.  Cardiovascular:     Rate and Rhythm: Normal rate and regular rhythm.     Heart sounds: Normal heart sounds.  Pulmonary:     Effort: Pulmonary effort is normal. No respiratory distress.     Breath sounds: Normal breath sounds.  Musculoskeletal:     Cervical back: Neck supple.  Skin:    General: Skin is warm and dry.  Neurological:     Mental Status: She is alert.  Psychiatric:        Mood and Affect: Mood normal.        Behavior: Behavior normal.      UC Treatments / Results  Labs (all labs ordered are listed, but only abnormal results are displayed) Labs Reviewed  POCT RAPID STREP A (OFFICE)    EKG   Radiology No results found.  Procedures Procedures (including critical care time)  Medications Ordered in UC Medications - No data to display  Initial Impression / Assessment and Plan / UC Course  I have reviewed the triage vital signs and the nursing notes.  Pertinent labs & imaging results that were available during my care of the patient were reviewed by me and  considered in my medical decision making (see chart for details).    Laryngitis, sore throat.  Rapid strep negative.  Negative COVID test at home yesterday and patient declines COVID test here.  Patient has been  symptomatic x 1 day.  Lengthy discussion with patient about antibiotic stewardship.  Discussed symptomatic care at length.  After much discussion, treating with 3 days of prednisone.  Instructed patient to take Tylenol or ibuprofen as needed and to follow-up with her PCP.  Education provided on laryngitis and sore throat.  Patient agrees to plan of care.    Final Clinical Impressions(s) / UC Diagnoses   Final diagnoses:  Laryngitis  Sore throat     Discharge Instructions      The strep test is negative.    Take the prednisone as directed.  Take Tylenol as needed for fever or discomfort.  Rest your voice.    Follow-up with your primary care provider if your symptoms are not improving.         ED Prescriptions     Medication Sig Dispense Auth. Provider   predniSONE (DELTASONE) 10 MG tablet Take 4 tablets (40 mg total) by mouth daily for 3 days. 12 tablet Mickie Bail, NP      PDMP not reviewed this encounter.   Mickie Bail, NP 01/18/22 [phone]

## 2022-02-03 ENCOUNTER — Ambulatory Visit
Admission: EM | Admit: 2022-02-03 | Discharge: 2022-02-03 | Disposition: A | Discharge status: Home / Self Care | Payer: BC Managed Care – PPO

## 2022-02-03 ENCOUNTER — Other Ambulatory Visit: Payer: Self-pay

## 2022-02-03 ENCOUNTER — Encounter: Payer: Self-pay | Admitting: Emergency Medicine

## 2022-02-03 DIAGNOSIS — R6889 Other general symptoms and signs: Secondary | ICD-10-CM

## 2022-02-03 MED ORDER — BENZONATATE 100 MG PO CAPS: ORAL_CAPSULE | ORAL | 0 refills | Status: DC

## 2022-02-03 NOTE — Discharge Instructions (Addendum)
You have been diagnosed with a viral upper respiratory infection based on your symptoms and exam. Viral illnesses cannot be treated with antibiotics - they are self limiting - and you should find your symptoms resolving within a few days. Get plenty of rest and non-caffeinated fluids. Watch for signs of dehydration including reduced urine output and dark colored urine.  We recommend you use over-the-counter medications for symptom control including acetaminophen (Tylenol), ibuprofen (Advil/Motrin) or naproxen (Aleve) for throat pain, fever, chills or body aches. You may combine use of acetaminophen and ibuprofen/naproxen if needed.  Some patients find an pain-relieving throat spray such as Chloraseptic to be effective.     Also recommend cold/cough medication containing a cough suppressant such as dextromethorphan, as needed.  Saline mist spray is helpful for removing excess mucus from your nose.  Room humidifiers are helpful to ease breathing at night. I recommend guaifenesin (Mucinex) with plenty of water throughout the day to help thin and loosen mucus secretions in your respiratory passages.   If appropriate based upon your other medical problems, you might also find relief of nasal/sinus congestion symptoms by using a nasal decongestant such as fluticasone (Flonase ) or pseudoephedrine (Sudafed sinus).  You will need to obtain Sudafed from behind the pharmacist counter.  Speak to the pharmacist to verify that you are not duplicating medications with other over-the-counter formulations that you may be using.   

## 2022-02-03 NOTE — ED Triage Notes (Signed)
Patient has a cough and is coughing up thick yellow/brown phlegm. Has been drinking hot water, tea.  Patient reports hoarseness.  Onset Saturday of the above symptoms.  Reports fever  yesterday, and says her throat hurts particularly with coughing, including chest sore ness with coughing.    Has used inhalers, taking motrin

## 2022-02-03 NOTE — ED Provider Notes (Signed)
Roderic Palau    CSN: [id number] Arrival date & time: 02/03/22  1417      History   Chief Complaint Chief Complaint  Patient presents with   Cough    HPI April Frederick is a 37 y.o. female.    Cough   Presents to urgent care with 2 to 3-day history of upper respiratory symptoms including productive cough.  She denies nasal congestion or sinus involvement.  She endorses coughing up thick yellow/brown sputum and reports acutely hoarse voice.  Symptoms starting Friday/Saturday.  She endorses very sore throat.  Reports fever yesterday.  She states that her children were diagnosed with flu last week though her symptoms are somewhat different than theirs.  She is using an albuterol inhaler for acute respiratory symptoms that include tightness in her chest.  She states she was recently diagnosed with asthma about 6 months ago and is using inhaler since.  She is taking Motrin every 6 hours for her symptoms.  Past Medical History:  Diagnosis Date   Ectopic pregnancy    Had MTX X 2   Herpes    Hypothyroidism     Patient Active Problem List   Diagnosis Date Noted   Herpes simplex 12/16/2021   Multigravida of advanced maternal age 59/27/2023   Encounter for elective induction of labor 05/23/2019   False labor 05/07/2019   Precordial pain 06/24/2016   NSVD (normal spontaneous vaginal delivery) 05/27/2015   Labor and delivery indication for care or intervention 05/26/2015    Past Surgical History:  Procedure Laterality Date   NO PAST SURGERIES     OTHER SURGICAL HISTORY     states had surgery  at [facility] uncertain of year does not remember what  it was about.    OB History     Gravida  4   Para  2   Term  2   Preterm      AB  2   Living  2      SAB      IAB  1   Ectopic  1   Multiple  0   Live Births  2            Home Medications    Prior to Admission medications   Medication Sig Start Date End Date Taking? Authorizing Provider   cholecalciferol (VITAMIN D3) 25 MCG (1000 UNIT) tablet Take 1,000 Units by mouth daily.   Yes [provider]  acetaminophen (TYLENOL) 325 MG tablet Take 2 tablets (650 mg total) by mouth every 6 (six) hours as needed for mild pain (for pain scale < 4). 05/28/15   Everlene Farrier, MD  albuterol (VENTOLIN HFA) 108 (90 Base) MCG/ACT inhaler INHALE 1-2 PUFFS EVERY 4-6 HOURS AS NEEDED FOR SHORTNESS OF BREATH    [provider]  ibuprofen (ADVIL) 800 MG tablet Take 1 tablet (800 mg total) by mouth every 8 (eight) hours as needed for moderate pain. 05/25/19   Morris, Jinny Blossom, DO  levothyroxine (SYNTHROID) 25 MCG tablet Take by mouth.    [provider]  methimazole (TAPAZOLE) 5 MG tablet methimazole 5 mg tablet  TAKE 1 TABLET BY MOUTH EVERY DAY Patient not taking: Reported on 02/03/2022    [provider]  Prenatal Vit-Fe Fumarate-FA (PRENATAL MULTIVITAMIN) TABS tablet Take 1 tablet by mouth daily. Patient not taking: Reported on 02/03/2022    [provider]    Family History Family History  Problem Relation Age of Onset   Diabetes Mother  type 2   Cancer Paternal Aunt    Diabetes Paternal Grandfather    Cancer Paternal Grandfather    Asthma Neg Hx     Social History Social History   Tobacco Use   Smoking status: Former   Smokeless tobacco: Never   Tobacco comments:    quit 2010  Vaping Use   Vaping Use: Never used  Substance Use Topics   Alcohol use: No   Drug use: No     Allergies   Patient has no known allergies.   Review of Systems Review of Systems  Respiratory:  Positive for cough.      Physical Exam Triage Vital Signs ED Triage Vitals  Enc Vitals Group     BP 02/03/22 1459 (!) 149/82     Pulse Rate 02/03/22 1459 84     Resp 02/03/22 1459 18     Temp 02/03/22 1459 99 F (37.2 C)     Temp Source 02/03/22 1459 Oral     SpO2 02/03/22 1459 98 %     Weight --      Height --      Head Circumference --      Peak Flow  --      Pain Score 02/03/22 1454 8     Pain Loc --      Pain Edu? --      Excl. in [place]? --    No data found.  Updated Vital Signs BP (!) 149/82 (BP Location: Left Arm)   Pulse 84   Temp 99 F (37.2 C) (Oral)   Resp 18   LMP 01/29/2022   SpO2 98%   Visual Acuity Right Eye Distance:   Left Eye Distance:   Bilateral Distance:    Right Eye Near:   Left Eye Near:    Bilateral Near:     Physical Exam Vitals reviewed.  Constitutional:      Appearance: Normal appearance. She is ill-appearing.  HENT:     Nose: Nose normal.     Mouth/Throat:     Pharynx: Posterior oropharyngeal erythema present. No oropharyngeal exudate.  Cardiovascular:     Rate and Rhythm: Normal rate and regular rhythm.     Pulses: Normal pulses.     Heart sounds: Normal heart sounds.  Pulmonary:     Effort: Pulmonary effort is normal.     Breath sounds: Normal breath sounds. No wheezing or rhonchi.  Skin:    General: Skin is warm and dry.  Neurological:     General: No focal deficit present.     Mental Status: She is alert and oriented to person, place, and time.  Psychiatric:        Mood and Affect: Mood normal.        Behavior: Behavior normal.      UC Treatments / Results  Labs (all labs ordered are listed, but only abnormal results are displayed) Labs Reviewed - No data to display  EKG   Radiology No results found.  Procedures Procedures (including critical care time)  Medications Ordered in UC Medications - No data to display  Initial Impression / Assessment and Plan / UC Course  I have reviewed the triage vital signs and the nursing notes.  Pertinent labs & imaging results that were available during my care of the patient were reviewed by me and considered in my medical decision making (see chart for details).   Patient is afebrile here without recent antipyretics. Satting well on room air. Overall is  ill appearing, well hydrated, without respiratory distress. Pulmonary exam  is unremarkable.  Lungs CTAB without wheezing, rhonchi, rales.  Oropharynx is only mildly erythematous without peritonsillar exudates present.  Patient's symptoms are consistent with an acute viral process being including influenza or COVID.  She is probably outside the window for antiviral treatment and will not offer Tamiflu.  Her most concerning symptom is cough and so will provide benzonatate and recommend she use Mucinex DM  help thin her mucus secretions as well as better control her cough.  She states she needs to be awake for her children at night and cannot take cough syrups that are sedating.  Final Clinical Impressions(s) / UC Diagnoses   Final diagnoses:  None   Discharge Instructions   None    ED Prescriptions   None    PDMP not reviewed this encounter.   Charma Igo, [place] 02/03/22 1524

## 2022-02-27 ENCOUNTER — Telehealth: Payer: Self-pay

## 2022-02-27 MED ORDER — AMOXICILLIN-POT CLAVULANATE 875-125 MG PO TABS: 1.0000 | ORAL_TABLET | Freq: Two times a day (BID) | ORAL | 0 refills | Status: DC

## 2022-02-27 NOTE — Addendum Note (Signed)
Addended by: Georgian Co on: 02/27/2022 09:50 PM   Modules accepted: Orders

## 2022-02-27 NOTE — Telephone Encounter (Signed)
Pt called and left vm regarding she's been having issues with sore throat again, took her daughters to the dr this morning & they both tested positive for strep. Asked since you don't have any appts available today & tomorrow, if you can send rx for her? Please advise

## 2022-10-20 ENCOUNTER — Other Ambulatory Visit: Payer: Self-pay

## 2022-10-21 ENCOUNTER — Other Ambulatory Visit: Payer: Self-pay

## 2022-10-21 MED ORDER — IBUPROFEN 800 MG PO TABS: 800.0000 mg | ORAL_TABLET | Freq: Three times a day (TID) | ORAL | 0 refills | Status: DC | PRN

## 2022-11-19 ENCOUNTER — Other Ambulatory Visit: Payer: Self-pay | Admitting: Family

## 2022-11-19 ENCOUNTER — Encounter: Payer: Self-pay | Admitting: Family

## 2022-11-19 MED ORDER — LEVOFLOXACIN 500 MG PO TABS: 500.0000 mg | ORAL_TABLET | Freq: Every day | ORAL | 0 refills | Status: DC

## 2022-11-19 MED ORDER — LEVOFLOXACIN 500 MG PO TABS: 500.0000 mg | ORAL_TABLET | Freq: Every day | ORAL | 0 refills | Status: AC

## 2023-01-29 ENCOUNTER — Other Ambulatory Visit: Payer: Self-pay | Admitting: Family

## 2023-01-29 ENCOUNTER — Encounter: Payer: Self-pay | Admitting: Cardiology

## 2023-01-29 ENCOUNTER — Telehealth: Payer: Self-pay | Admitting: Family

## 2023-01-29 ENCOUNTER — Ambulatory Visit: Payer: BC Managed Care – PPO | Admitting: Cardiology

## 2023-01-29 VITALS — BP 128/64 | HR 77 | Ht 65.5 in | Wt 158.0 lb

## 2023-01-29 DIAGNOSIS — J018 Other acute sinusitis: Secondary | ICD-10-CM | POA: Diagnosis not present

## 2023-01-29 DIAGNOSIS — J019 Acute sinusitis, unspecified: Secondary | ICD-10-CM | POA: Insufficient documentation

## 2023-01-29 MED ORDER — ALBUTEROL SULFATE HFA 108 (90 BASE) MCG/ACT IN AERS: 2.0000 | INHALATION_SPRAY | Freq: Four times a day (QID) | RESPIRATORY_TRACT | 5 refills | Status: AC | PRN

## 2023-01-29 MED ORDER — AMOXICILLIN-POT CLAVULANATE 875-125 MG PO TABS: 1.0000 | ORAL_TABLET | Freq: Two times a day (BID) | ORAL | 0 refills | Status: DC

## 2023-01-29 MED ORDER — BENZONATATE 100 MG PO CAPS: 100.0000 mg | ORAL_CAPSULE | Freq: Three times a day (TID) | ORAL | 1 refills | Status: AC | PRN

## 2023-01-29 NOTE — Telephone Encounter (Signed)
 Patient was seen by April Frederick already today

## 2023-01-29 NOTE — Progress Notes (Signed)
 Established Patient Office Visit  Subjective:  Patient ID: April Frederick, female    DOB: 1985-09-22  Age: 38 y.o. MRN: [id number]  Chief Complaint  Patient presents with   Acute Visit    Sinus infection x 3 days, kids at home sick with same sx    Patient in office for an acute visit, complaining of a sore throat, started 3-4 days ago. Patient reports her children have been sick, being treated for sinus infections. Will send in Augmentin , tessalon  pearls. Albuterol  refilled per patient request.   Sinus Problem This is a new problem. The current episode started in the past 7 days. The problem is unchanged. There has been no fever. She is experiencing no pain. Associated symptoms include congestion, coughing, shortness of breath, sneezing and a sore throat. Pertinent negatives include no ear pain, headaches or sinus pressure.    No other concerns at this time.   Past Medical History:  Diagnosis Date   Ectopic pregnancy    Had MTX X 2   Encounter for elective induction of labor 05/23/2019   False labor 05/07/2019   Herpes    Hypothyroidism    Labor and delivery indication for care or intervention 05/26/2015   Multigravida of advanced maternal age 64/27/2023   NSVD (normal spontaneous vaginal delivery) 05/27/2015    Past Surgical History:  Procedure Laterality Date   NO PAST SURGERIES     OTHER SURGICAL HISTORY     states had surgery  at [facility] uncertain of year does not remember what  it was about.    Social History   Socioeconomic History   Marital status: Legally Separated    Spouse name: Not on file   Number of children: Not on file   Years of education: Not on file   Highest education level: Not on file  Occupational History   Not on file  Tobacco Use   Smoking status: Former   Smokeless tobacco: Never   Tobacco comments:    quit 2010  Vaping Use   Vaping status: Never Used  Substance and Sexual Activity   Alcohol use: No   Drug use: No   Sexual  activity: Yes    Birth control/protection: None  Other Topics Concern   Not on file  Social History Narrative   Not on file   Social Drivers of Health   Financial Resource Strain: Not on file  Food Insecurity: Not on file  Transportation Needs: Not on file  Physical Activity: Not on file  Stress: Not on file  Social Connections: Not on file  Intimate Partner Violence: Not on file    Family History  Problem Relation Age of Onset   Diabetes Mother        type 2   Cancer Paternal Aunt    Diabetes Paternal Grandfather    Cancer Paternal Grandfather    Asthma Neg Hx     No Known Allergies  Outpatient Medications Prior to Visit  Medication Sig   ascorbic acid (VITAMIN C) 500 MG tablet Take 500 mg by mouth daily.   cetirizine (ZYRTEC) 10 MG chewable tablet Chew 10 mg by mouth daily.   cholecalciferol (VITAMIN D3) 25 MCG (1000 UNIT) tablet Take 1,000 Units by mouth daily.   ibuprofen  (ADVIL ) 800 MG tablet Take 1 tablet (800 mg total) by mouth every 8 (eight) hours as needed for moderate pain.   levothyroxine (SYNTHROID) 25 MCG tablet Take by mouth.   [DISCONTINUED] albuterol  (VENTOLIN  HFA) 108 (90 Base) MCG/ACT  inhaler INHALE 1-2 PUFFS EVERY 4-6 HOURS AS NEEDED FOR SHORTNESS OF BREATH   [DISCONTINUED] acetaminophen  (TYLENOL ) 325 MG tablet Take 2 tablets (650 mg total) by mouth every 6 (six) hours as needed for mild pain (for pain scale < 4).   [DISCONTINUED] amoxicillin -clavulanate (AUGMENTIN ) 875-125 MG tablet Take 1 tablet by mouth 2 (two) times daily.   [DISCONTINUED] benzonatate  (TESSALON ) 100 MG capsule Take 1-2 tablets 3 times a day as needed for cough   [DISCONTINUED] methimazole (TAPAZOLE) 5 MG tablet methimazole 5 mg tablet  TAKE 1 TABLET BY MOUTH EVERY DAY (Patient not taking: Reported on 02/03/2022)   [DISCONTINUED] Prenatal Vit-Fe Fumarate-FA (PRENATAL MULTIVITAMIN) TABS tablet Take 1 tablet by mouth daily. (Patient not taking: Reported on 02/03/2022)   No  facility-administered medications prior to visit.    Review of Systems  Constitutional: Negative.   HENT:  Positive for congestion, sneezing and sore throat. Negative for ear pain, sinus pressure and sinus pain.   Eyes: Negative.   Respiratory:  Positive for cough and shortness of breath. Negative for sputum production.   Cardiovascular: Negative.  Negative for chest pain.  Gastrointestinal: Negative.  Negative for abdominal pain, constipation and diarrhea.  Genitourinary: Negative.   Musculoskeletal:  Negative for joint pain and myalgias.  Skin: Negative.   Neurological: Negative.  Negative for dizziness and headaches.  Endo/Heme/Allergies: Negative.   All other systems reviewed and are negative.      Objective:   BP 128/64   Pulse 77   Ht 5' 5.5 (1.664 m)   Wt 158 lb (71.7 kg)   SpO2 97%   BMI 25.89 kg/m   Vitals:   01/29/23 0912  BP: 128/64  Pulse: 77  Height: 5' 5.5 (1.664 m)  Weight: 158 lb (71.7 kg)  SpO2: 97%  BMI (Calculated): 25.88    Physical Exam Vitals and nursing note reviewed.  Constitutional:      Appearance: Normal appearance. She is normal weight.  HENT:     Head: Normocephalic and atraumatic.     Nose: Nose normal.     Mouth/Throat:     Mouth: Mucous membranes are moist.  Eyes:     Extraocular Movements: Extraocular movements intact.     Conjunctiva/sclera: Conjunctivae normal.     Pupils: Pupils are equal, round, and reactive to light.  Cardiovascular:     Rate and Rhythm: Normal rate and regular rhythm.     Pulses: Normal pulses.     Heart sounds: Normal heart sounds.  Pulmonary:     Effort: Pulmonary effort is normal.     Breath sounds: Normal breath sounds.  Abdominal:     General: Abdomen is flat. Bowel sounds are normal.     Palpations: Abdomen is soft.  Musculoskeletal:        General: Normal range of motion.     Cervical back: Normal range of motion.  Skin:    General: Skin is warm and dry.  Neurological:     General: No  focal deficit present.     Mental Status: She is alert and oriented to person, place, and time.  Psychiatric:        Frederick and Affect: Frederick normal.        Behavior: Behavior normal.        Thought Content: Thought content normal.        Judgment: Judgment normal.      No results found for any visits on 01/29/23.  No results found for this or any previous  visit (from the past 2160 hours).    Assessment & Plan:  Augmentin  Tessalon  pearls Increase fluid intake  Problem List Items Addressed This Visit       Respiratory   Acute infection of nasal sinus - Primary   Relevant Medications   cetirizine (ZYRTEC) 10 MG chewable tablet   amoxicillin -clavulanate (AUGMENTIN ) 875-125 MG tablet   benzonatate  (TESSALON  PERLES) 100 MG capsule    Return if symptoms worsen or fail to improve.   Total time spent: 25 minutes  [employer], NP  01/29/2023   This document may have been prepared by Dragon Voice Recognition software and as such may include unintentional dictation errors.

## 2023-01-29 NOTE — Telephone Encounter (Signed)
 Patient left VM that she and her kids are sick. Marchelle Folks doesn't have any availability until next week so she was wanting to see if we can send in the same thing her kids are taking. Did not state what meds the kids are taking though.

## 2023-02-18 ENCOUNTER — Telehealth: Payer: Self-pay | Admitting: Family

## 2023-02-18 NOTE — Telephone Encounter (Signed)
Patient called in stating both her kids are Flu A positive and she now has a fever and diarrhea. Her temp is 101 then she took Motrin that brought it down to 99.3. Her symptoms started today. Can we send her in Tamiflu?   [facility] [address]

## 2023-02-19 ENCOUNTER — Other Ambulatory Visit: Payer: Self-pay | Admitting: Family

## 2023-02-19 MED ORDER — OSELTAMIVIR PHOSPHATE 75 MG PO CAPS: 75.0000 mg | ORAL_CAPSULE | Freq: Two times a day (BID) | ORAL | 0 refills | Status: AC

## 2023-02-19 NOTE — Telephone Encounter (Signed)
Patient informed that tamiflu was sent in to [facility]

## 2023-02-25 ENCOUNTER — Ambulatory Visit (INDEPENDENT_AMBULATORY_CARE_PROVIDER_SITE_OTHER): Payer: BC Managed Care – PPO | Admitting: Family

## 2023-02-25 ENCOUNTER — Encounter: Payer: Self-pay | Admitting: Family

## 2023-02-25 VITALS — BP 123/74 | HR 69 | Ht 66.0 in | Wt 160.4 lb

## 2023-02-25 DIAGNOSIS — B09 Unspecified viral infection characterized by skin and mucous membrane lesions: Secondary | ICD-10-CM | POA: Diagnosis not present

## 2023-02-25 DIAGNOSIS — Z0130 Encounter for examination of blood pressure without abnormal findings: Secondary | ICD-10-CM

## 2023-02-25 MED ORDER — HYDROXYZINE HCL 10 MG PO TABS: 10.0000 mg | ORAL_TABLET | Freq: Three times a day (TID) | ORAL | 0 refills | Status: AC | PRN

## 2023-02-25 MED ORDER — TRIAMCINOLONE ACETONIDE 0.1 % EX CREA: 1.0000 | TOPICAL_CREAM | Freq: Two times a day (BID) | CUTANEOUS | 0 refills | Status: AC

## 2023-02-25 NOTE — Progress Notes (Signed)
 Established Patient Office Visit  Subjective:  Patient ID: April Frederick, female    DOB: 05/07/1985  Age: 38 y.o. MRN: [id number]  Chief Complaint  Patient presents with   Rash    Pt had flu last week, rash started when she had the flu  poss rash on chest moving to arms    Patient is here today for a rash.  She recently had the flu and right before she got sick she started having a rash pop up on her body that started on her chest but has now spread.     Rash This is a new problem. The current episode started 1 to 4 weeks ago. The problem has been gradually worsening since onset. The rash is diffuse. The rash is characterized by itchiness, swelling, dryness, redness and peeling. Associated with: recent illness - influenza. Past treatments include antibiotic cream, antihistamine, anti-itch cream, moisturizer and cold compress. The treatment provided no relief. Her past medical history is significant for allergies and varicella.    No other concerns at this time.   Past Medical History:  Diagnosis Date   Ectopic pregnancy    Had MTX X 2   Encounter for elective induction of labor 05/23/2019   False labor 05/07/2019   Herpes    Hypothyroidism    Labor and delivery indication for care or intervention 05/26/2015   Multigravida of advanced maternal age 25/27/2023   NSVD (normal spontaneous vaginal delivery) 05/27/2015    Past Surgical History:  Procedure Laterality Date   NO PAST SURGERIES     OTHER SURGICAL HISTORY     states had surgery  at [facility] uncertain of year does not remember what  it was about.    Social History   Socioeconomic History   Marital status: Legally Separated    Spouse name: Not on file   Number of children: Not on file   Years of education: Not on file   Highest education level: Not on file  Occupational History   Not on file  Tobacco Use   Smoking status: Former   Smokeless tobacco: Never   Tobacco comments:    quit 2010  Vaping Use    Vaping status: Never Used  Substance and Sexual Activity   Alcohol use: No   Drug use: No   Sexual activity: Yes    Birth control/protection: None  Other Topics Concern   Not on file  Social History Narrative   Not on file   Social Drivers of Health   Financial Resource Strain: Not on file  Food Insecurity: Not on file  Transportation Needs: Not on file  Physical Activity: Not on file  Stress: Not on file  Social Connections: Not on file  Intimate Partner Violence: Not on file    Family History  Problem Relation Age of Onset   Diabetes Mother        type 2   Cancer Paternal Aunt    Diabetes Paternal Grandfather    Cancer Paternal Grandfather    Asthma Neg Hx     No Known Allergies  Review of Systems  Skin:  Positive for rash.  All other systems reviewed and are negative.      Objective:   BP 123/74   Pulse 69   Ht 5' 6 (1.676 m)   Wt 160 lb 6.4 oz (72.8 kg)   SpO2 99%   BMI 25.89 kg/m   Vitals:   02/25/23 0927  BP: 123/74  Pulse: 69  Height:  5' 6 (1.676 m)  Weight: 160 lb 6.4 oz (72.8 kg)  SpO2: 99%  BMI (Calculated): 25.9    Physical Exam Vitals and nursing note reviewed.  Constitutional:      Appearance: Normal appearance. She is normal weight.  HENT:     Head: Normocephalic.  Eyes:     Extraocular Movements: Extraocular movements intact.     Conjunctiva/sclera: Conjunctivae normal.     Pupils: Pupils are equal, round, and reactive to light.  Cardiovascular:     Rate and Rhythm: Normal rate.  Pulmonary:     Effort: Pulmonary effort is normal.  Skin:    Findings: Rash (diffuse) present. Rash is urticarial.  Neurological:     General: No focal deficit present.     Mental Status: She is alert and oriented to person, place, and time. Mental status is at baseline.  Psychiatric:        Mood and Affect: Mood normal.        Behavior: Behavior normal.        Thought Content: Thought content normal.      No results found for any  visits on 02/25/23.  No results found for this or any previous visit (from the past 2160 hours).     Assessment & Plan:   Problem List Items Addressed This Visit   None Visit Diagnoses       Viral rash    -  Primary      I highly suspect that the rash is viral, as she just got over a bout with the flu.  Given this, I have sent her antihistamines and a steroid cream just in case this does not improve her symptoms.  She will let me know if not improving.   Return if symptoms worsen or fail to improve.   Total time spent: 20 minutes  ALAN CHRISTELLA ARRANT, FNP  02/25/2023   This document may have been prepared by [facility] Voice Recognition software and as such may include unintentional dictation errors.

## 2023-03-01 ENCOUNTER — Encounter: Payer: Self-pay | Admitting: Family

## 2023-05-03 ENCOUNTER — Other Ambulatory Visit: Payer: Self-pay | Admitting: Internal Medicine

## 2023-05-03 DIAGNOSIS — N3000 Acute cystitis without hematuria: Secondary | ICD-10-CM

## 2023-05-03 MED ORDER — CIPROFLOXACIN HCL 500 MG PO TABS: 500.0000 mg | ORAL_TABLET | Freq: Two times a day (BID) | ORAL | 0 refills | Status: AC

## 2023-05-04 ENCOUNTER — Ambulatory Visit: Admitting: Family

## 2023-05-04 DIAGNOSIS — R300 Dysuria: Secondary | ICD-10-CM

## 2023-05-04 LAB — POCT URINALYSIS DIPSTICK
Bilirubin, UA: NEGATIVE
Glucose, UA: NEGATIVE
Ketones, UA: NEGATIVE
Leukocytes, UA: NEGATIVE
Nitrite, UA: NEGATIVE
Protein, UA: NEGATIVE
Spec Grav, UA: >=1.03 — AB (ref 1.010–1.025)
Urobilinogen, UA: 0.2 U/dL
pH, UA: 6 (ref 5.0–8.0)

## 2023-05-04 MED ORDER — NITROFURANTOIN MONOHYD MACRO 100 MG PO CAPS: 100.0000 mg | ORAL_CAPSULE | Freq: Two times a day (BID) | ORAL | 0 refills | Status: AC

## 2023-05-05 LAB — URINALYSIS, ROUTINE W REFLEX MICROSCOPIC
Bilirubin, UA: NEGATIVE
Glucose, UA: NEGATIVE
Ketones, UA: NEGATIVE
Leukocytes,UA: NEGATIVE
Nitrite, UA: NEGATIVE
RBC, UA: NEGATIVE
Specific Gravity, UA: >=1.03 — AB (ref 1.005–1.030)
Urobilinogen, Ur: 0.2 mg/dL (ref 0.2–1.0)
pH, UA: 6 (ref 5.0–7.5)

## 2023-05-06 LAB — URINE CULTURE: Organism ID, Bacteria: NO GROWTH

## 2023-05-09 ENCOUNTER — Other Ambulatory Visit: Payer: Self-pay | Admitting: Family

## 2023-05-11 ENCOUNTER — Telehealth: Payer: Self-pay | Admitting: Family

## 2023-05-11 NOTE — Telephone Encounter (Signed)
 Patient left VM wanting her UA results from last week.  Per April Frederick verbal, her UA only shows that she was dehydrated and needs to drink more water.

## 2023-05-17 ENCOUNTER — Encounter: Payer: Self-pay | Admitting: Family

## 2023-05-17 NOTE — Progress Notes (Signed)
   CHIEF COMPLAINT  UA/ only visit fot UTI     REASON FOR VISIT  Possible UTI, UA Visit Only      ASSESSMENT & PLAN Diagnoses and all orders for this visit:  Dysuria -     POCT Urinalysis Dipstick (81002) -     Urinalysis, Routine w reflex microscopic -     Urine Culture  Other orders -     nitrofurantoin , macrocrystal-monohydrate, (MACROBID ) 100 MG capsule; Take 1 capsule (100 mg total) by mouth 2 (two) times daily.     Patient notified.  Total time spent: 5 minutes  Trenda Frisk, FNP 05/04/2023

## 2023-08-04 ENCOUNTER — Other Ambulatory Visit: Payer: Self-pay | Admitting: Family

## 2023-08-04 ENCOUNTER — Ambulatory Visit: Admitting: Family

## 2023-08-04 ENCOUNTER — Telehealth: Payer: Self-pay | Admitting: Family

## 2023-08-04 ENCOUNTER — Other Ambulatory Visit: Payer: Self-pay

## 2023-08-04 DIAGNOSIS — J018 Other acute sinusitis: Secondary | ICD-10-CM

## 2023-08-04 MED ORDER — FLUCONAZOLE 150 MG PO TABS: 150.0000 mg | ORAL_TABLET | ORAL | 0 refills | Status: AC

## 2023-08-04 MED ORDER — AMOXICILLIN-POT CLAVULANATE 875-125 MG PO TABS: 1.0000 | ORAL_TABLET | Freq: Two times a day (BID) | ORAL | 0 refills | Status: DC

## 2023-08-04 NOTE — Telephone Encounter (Signed)
 Patient called stating that her kids had been sick and was prescribed augmentin  clavulanic for bacterial infection. Patients states that they have all tested negative for covid, flu, rsv. [facility] [address] [facility] [employer]

## 2023-08-07 ENCOUNTER — Encounter: Payer: Self-pay | Admitting: Advanced Practice Midwife

## 2023-09-23 ENCOUNTER — Other Ambulatory Visit: Payer: Self-pay | Admitting: Family

## 2023-10-20 ENCOUNTER — Ambulatory Visit (INDEPENDENT_AMBULATORY_CARE_PROVIDER_SITE_OTHER): Admitting: Family

## 2023-10-20 ENCOUNTER — Telehealth: Payer: Self-pay

## 2023-10-20 DIAGNOSIS — J069 Acute upper respiratory infection, unspecified: Secondary | ICD-10-CM | POA: Diagnosis not present

## 2023-10-20 DIAGNOSIS — J029 Acute pharyngitis, unspecified: Secondary | ICD-10-CM

## 2023-10-20 LAB — POCT XPERT XPRESS SARS COVID-2/FLU/RSV
FLU A: NEGATIVE
FLU B: NEGATIVE
RSV RNA, PCR: NEGATIVE
SARS Coronavirus 2: NEGATIVE

## 2023-10-20 LAB — POCT RAPID STREP A (OFFICE): Rapid Strep A Screen: NEGATIVE

## 2023-10-20 NOTE — Telephone Encounter (Signed)
 A user error has taken place: encounter opened in error, closed for administrative reasons.

## 2023-10-21 NOTE — Progress Notes (Signed)
   Subjective   CHIEF COMPLAINT  COVID Testing    REASON FOR VISIT  COVID testing for URI symptoms.        Objective   Results for orders placed or performed in visit on 10/20/23  POCT rapid strep A  Result Value Ref Range   Rapid Strep A Screen Negative Negative  POCT XPERT XPRESS SARS COVID-2/FLU/RSV  Result Value Ref Range   SARS Coronavirus 2 Negative    FLU A Negative    FLU B Negative    RSV RNA, PCR Negative     Assessment & Plan  1. Acute upper respiratory infection (Primary)  - POCT rapid strep A - POCT XPERT XPRESS SARS COVID-2/FLU/RSV  2. Sore throat  - POCT rapid strep A - POCT XPERT XPRESS SARS COVID-2/FLU/RSV   Total time spent: 5 minutes  ALAN CHRISTELLA ARRANT, FNP 10/20/2023

## 2023-10-22 ENCOUNTER — Telehealth: Payer: Self-pay

## 2023-10-22 ENCOUNTER — Other Ambulatory Visit: Payer: Self-pay

## 2023-10-22 MED ORDER — AMOXICILLIN-POT CLAVULANATE 875-125 MG PO TABS: 1.0000 | ORAL_TABLET | Freq: Two times a day (BID) | ORAL | 0 refills | Status: AC

## 2023-10-22 NOTE — Telephone Encounter (Signed)
 Pt called stating she was at the office Tuesday and got tested. All tests came back negative and was told to wait until Friday until she could get antibiotics. Pt states she does not think she can wait until Friday. Pt has a sore throat, has yellow mucus, and has been using her inhaler nonstop. Pt states 2 of her kids are on Augmentin  for a sinus infection and is thinking she has the same thing and is requesting antibiotics.

## 2023-11-04 ENCOUNTER — Other Ambulatory Visit: Payer: Self-pay | Admitting: Family

## 2023-11-04 DIAGNOSIS — Z111 Encounter for screening for respiratory tuberculosis: Secondary | ICD-10-CM

## 2023-11-25 ENCOUNTER — Other Ambulatory Visit

## 2023-11-28 LAB — QUANTIFERON-TB GOLD PLUS
QuantiFERON Mitogen Value: >10 [IU]/mL
QuantiFERON Nil Value: 0.01 [IU]/mL
QuantiFERON TB1 Ag Value: 0.02 [IU]/mL
QuantiFERON TB2 Ag Value: 0.01 [IU]/mL
QuantiFERON-TB Gold Plus: NEGATIVE

## 2024-01-09 ENCOUNTER — Other Ambulatory Visit: Payer: Self-pay | Admitting: Family
